# Patient Record
Sex: Female | Born: 1990 | Race: Black or African American | Hispanic: No | Marital: Married | Smoking: Never smoker
Health system: Southern US, Community
[De-identification: ages and names within clinical notes are randomized; demographics above are authoritative.]

## PROBLEM LIST (undated history)

## (undated) ENCOUNTER — Inpatient Hospital Stay (HOSPITAL_COMMUNITY): Payer: Self-pay

## (undated) DIAGNOSIS — Z5189 Encounter for other specified aftercare: Secondary | ICD-10-CM

## (undated) DIAGNOSIS — D649 Anemia, unspecified: Secondary | ICD-10-CM

## (undated) DIAGNOSIS — K8020 Calculus of gallbladder without cholecystitis without obstruction: Secondary | ICD-10-CM

## (undated) DIAGNOSIS — O139 Gestational [pregnancy-induced] hypertension without significant proteinuria, unspecified trimester: Secondary | ICD-10-CM

## (undated) DIAGNOSIS — O34219 Maternal care for unspecified type scar from previous cesarean delivery: Secondary | ICD-10-CM

## (undated) DIAGNOSIS — D219 Benign neoplasm of connective and other soft tissue, unspecified: Secondary | ICD-10-CM

## (undated) HISTORY — DX: Benign neoplasm of connective and other soft tissue, unspecified: D21.9

## (undated) HISTORY — DX: Calculus of gallbladder without cholecystitis without obstruction: K80.20

## (undated) HISTORY — PX: NO PAST SURGERIES: SHX2092

---

## 2013-04-07 LAB — OB RESULTS CONSOLE RPR: RPR: NONREACTIVE

## 2013-04-07 LAB — OB RESULTS CONSOLE RUBELLA ANTIBODY, IGM: Rubella: IMMUNE

## 2013-04-07 LAB — OB RESULTS CONSOLE HEPATITIS B SURFACE ANTIGEN: Hepatitis B Surface Ag: NEGATIVE

## 2013-09-15 LAB — OB RESULTS CONSOLE RPR: RPR: NONREACTIVE

## 2013-09-15 LAB — OB RESULTS CONSOLE GBS: GBS: NEGATIVE

## 2013-09-15 LAB — OB RESULTS CONSOLE HIV ANTIBODY (ROUTINE TESTING): HIV: NONREACTIVE

## 2013-09-30 ENCOUNTER — Inpatient Hospital Stay (HOSPITAL_COMMUNITY): Admission: AD | Admit: 2013-09-30 | Payer: Self-pay | Source: Ambulatory Visit | Admitting: Obstetrics and Gynecology

## 2013-10-12 ENCOUNTER — Telehealth (HOSPITAL_COMMUNITY): Payer: Self-pay | Admitting: *Deleted

## 2013-10-12 ENCOUNTER — Encounter (HOSPITAL_COMMUNITY): Payer: Self-pay | Admitting: *Deleted

## 2013-10-12 NOTE — Telephone Encounter (Signed)
Preadmission screen  

## 2013-10-13 ENCOUNTER — Other Ambulatory Visit: Payer: Self-pay | Admitting: Obstetrics and Gynecology

## 2013-10-14 ENCOUNTER — Inpatient Hospital Stay (HOSPITAL_COMMUNITY)
Admission: RE | Admit: 2013-10-14 | Discharge: 2013-10-16 | DRG: 774 | Disposition: A | Discharge status: Home / Self Care | Payer: Medicaid Other | Source: Ambulatory Visit | Attending: Obstetrics and Gynecology | Admitting: Obstetrics and Gynecology

## 2013-10-14 ENCOUNTER — Encounter (HOSPITAL_COMMUNITY): Payer: Medicaid Other | Admitting: Anesthesiology

## 2013-10-14 ENCOUNTER — Encounter (HOSPITAL_COMMUNITY): Payer: Self-pay

## 2013-10-14 ENCOUNTER — Inpatient Hospital Stay (HOSPITAL_COMMUNITY): Payer: Medicaid Other | Admitting: Anesthesiology

## 2013-10-14 DIAGNOSIS — D259 Leiomyoma of uterus, unspecified: Secondary | ICD-10-CM | POA: Diagnosis present

## 2013-10-14 DIAGNOSIS — O34599 Maternal care for other abnormalities of gravid uterus, unspecified trimester: Secondary | ICD-10-CM | POA: Diagnosis present

## 2013-10-14 DIAGNOSIS — O43893 Other placental disorders, third trimester: Secondary | ICD-10-CM

## 2013-10-14 DIAGNOSIS — D4959 Neoplasm of unspecified behavior of other genitourinary organ: Secondary | ICD-10-CM | POA: Diagnosis present

## 2013-10-14 LAB — CBC
HCT: 30.9 % — ABNORMAL LOW (ref 36.0–46.0)
Hemoglobin: 10.2 g/dL — ABNORMAL LOW (ref 12.0–15.0)
MCHC: 33 g/dL (ref 30.0–36.0)
MCV: 80.5 fL (ref 78.0–100.0)
WBC: 7.9 10*3/uL (ref 4.0–10.5)

## 2013-10-14 LAB — PREPARE RBC (CROSSMATCH)

## 2013-10-14 LAB — RPR: RPR Ser Ql: NONREACTIVE

## 2013-10-14 MED ORDER — PHENYLEPHRINE 40 MCG/ML (10ML) SYRINGE FOR IV PUSH (FOR BLOOD PRESSURE SUPPORT)
80.0000 ug | PREFILLED_SYRINGE | INTRAVENOUS | Status: DC | PRN
  Filled 2013-10-14: qty 10
  Filled 2013-10-14: qty 2

## 2013-10-14 MED ORDER — LIDOCAINE HCL (PF) 1 % IJ SOLN
30.0000 mL | INTRAMUSCULAR | Status: DC | PRN
  Filled 2013-10-14: qty 30

## 2013-10-14 MED ORDER — FLEET ENEMA 7-19 GM/118ML RE ENEM: 1.0000 | ENEMA | RECTAL | Status: DC | PRN

## 2013-10-14 MED ORDER — LACTATED RINGERS IV SOLN
INTRAVENOUS | Status: DC
  Administered 2013-10-14: 07:00:00 via INTRAVENOUS

## 2013-10-14 MED ORDER — IBUPROFEN 600 MG PO TABS: 600.0000 mg | ORAL_TABLET | Freq: Four times a day (QID) | ORAL | Status: DC | PRN

## 2013-10-14 MED ORDER — OXYTOCIN 40 UNITS IN LACTATED RINGERS INFUSION - SIMPLE MED: 62.5000 mL/h | INTRAVENOUS | Status: DC

## 2013-10-14 MED ORDER — FENTANYL 2.5 MCG/ML BUPIVACAINE 1/10 % EPIDURAL INFUSION (WH - ANES)
14.0000 mL/h | INTRAMUSCULAR | Status: DC | PRN
  Administered 2013-10-14: 14 mL/h via EPIDURAL
  Filled 2013-10-14: qty 125

## 2013-10-14 MED ORDER — OXYTOCIN BOLUS FROM INFUSION: 500.0000 mL | INTRAVENOUS | Status: DC

## 2013-10-14 MED ORDER — ONDANSETRON HCL 4 MG/2ML IJ SOLN: 4.0000 mg | Freq: Four times a day (QID) | INTRAMUSCULAR | Status: DC | PRN

## 2013-10-14 MED ORDER — LACTATED RINGERS IV SOLN: 500.0000 mL | INTRAVENOUS | Status: DC | PRN

## 2013-10-14 MED ORDER — CITRIC ACID-SODIUM CITRATE 334-500 MG/5ML PO SOLN: 30.0000 mL | ORAL | Status: DC | PRN

## 2013-10-14 MED ORDER — EPHEDRINE 5 MG/ML INJ
10.0000 mg | INTRAVENOUS | Status: DC | PRN
  Filled 2013-10-14: qty 4
  Filled 2013-10-14: qty 2

## 2013-10-14 MED ORDER — OXYCODONE-ACETAMINOPHEN 5-325 MG PO TABS: 1.0000 | ORAL_TABLET | ORAL | Status: DC | PRN

## 2013-10-14 MED ORDER — LACTATED RINGERS IV SOLN
INTRAVENOUS | Status: DC
  Administered 2013-10-14: 15:00:00 via INTRAVENOUS

## 2013-10-14 MED ORDER — SODIUM BICARBONATE 8.4 % IV SOLN
INTRAVENOUS | Status: DC | PRN
  Administered 2013-10-14: 5 mL via EPIDURAL

## 2013-10-14 MED ORDER — PHENYLEPHRINE 40 MCG/ML (10ML) SYRINGE FOR IV PUSH (FOR BLOOD PRESSURE SUPPORT)
80.0000 ug | PREFILLED_SYRINGE | INTRAVENOUS | Status: DC | PRN
  Filled 2013-10-14: qty 2

## 2013-10-14 MED ORDER — DIPHENHYDRAMINE HCL 50 MG/ML IJ SOLN: 12.5000 mg | INTRAMUSCULAR | Status: DC | PRN

## 2013-10-14 MED ORDER — ACETAMINOPHEN 325 MG PO TABS: 650.0000 mg | ORAL_TABLET | ORAL | Status: DC | PRN

## 2013-10-14 MED ORDER — LACTATED RINGERS IV SOLN: 500.0000 mL | Freq: Once | INTRAVENOUS | Status: DC

## 2013-10-14 MED ORDER — EPHEDRINE 5 MG/ML INJ
10.0000 mg | INTRAVENOUS | Status: DC | PRN
  Filled 2013-10-14: qty 2

## 2013-10-14 MED ORDER — LIDOCAINE HCL (PF) 1 % IJ SOLN
30.0000 mL | INTRAMUSCULAR | Status: DC | PRN
  Filled 2013-10-14 (×2): qty 30

## 2013-10-14 MED ORDER — OXYTOCIN 40 UNITS IN LACTATED RINGERS INFUSION - SIMPLE MED
1.0000 m[IU]/min | INTRAVENOUS | Status: DC
  Administered 2013-10-14: 5 m[IU]/min via INTRAVENOUS
  Administered 2013-10-14: 9 m[IU]/min via INTRAVENOUS
  Administered 2013-10-14: 11 m[IU]/min via INTRAVENOUS
  Administered 2013-10-14: 1 m[IU]/min via INTRAVENOUS
  Administered 2013-10-14: 7 m[IU]/min via INTRAVENOUS
  Filled 2013-10-14: qty 1000

## 2013-10-14 NOTE — Anesthesia Preprocedure Evaluation (Signed)

## 2013-10-14 NOTE — Progress Notes (Signed)
Patient ID: Alyssa Hubbard, female   DOB: 1990/12/28, 21 y.o.   MRN: [id number] The pt requested and received an epidural at 4PM and was 5 cm at [facility] and 6 cm at 7PM. Now, the cervix is fully dilated and the vertex is at +2 station. We will begin pushing

## 2013-10-14 NOTE — Anesthesia Procedure Notes (Signed)
Epidural Patient location during procedure: OB  Preanesthetic Checklist Completed: patient identified, site marked, surgical consent, pre-op evaluation, timeout performed, IV checked, risks and benefits discussed and monitors and equipment checked  Epidural Patient position: sitting Prep: site prepped and draped and DuraPrep Patient monitoring: continuous pulse ox and blood pressure Approach: midline Injection technique: LOR air  Needle:  Needle type: Tuohy  Needle gauge: 17 G Needle length: 9 cm and 9 Needle insertion depth: 8 cm Catheter type: closed end flexible Catheter size: 19 Gauge Catheter at skin depth: 17 cm Test dose: negative  Assessment Events: blood not aspirated, injection not painful, no injection resistance, negative IV test and no paresthesia  Additional Notes Dosing of Epidural:  1st dose, through catheter ............................................Marland Kitchen epi 1:200K + Xylocaine 40 mg  2nd dose, through catheter, after waiting 3 minutes...Marland KitchenMarland Kitchenepi 1:200K + Xylocaine 60 mg    ( 2% Xylo charted as a single dose in Epic Meds for ease of charting; actual dosing was fractionated as above, for saftey's sake)  As each dose occurred, patient was free of IV sx; and patient exhibited no evidence of SA injection.  Patient is more comfortable after epidural dosed. Please see RN's note for documentation of vital signs,and FHR which are stable.  Patient reminded not to try to ambulate with numb legs, and that an RN must be present when she attempts to get up.

## 2013-10-14 NOTE — Progress Notes (Signed)
Patient ID: Alyssa Hubbard, female   DOB: January 23, 1991, 22 y.o.   MRN: [id number] Pitocin has just been raised to 15 mu/ minute and the contractions are q 2-3 minutes but only slightly painful. There cervix is 3 cm 50% effaced and the vertex is at - 2 station AROM produced clear fluid.

## 2013-10-14 NOTE — Progress Notes (Signed)
Patient ID: Alyssa Hubbard, female   DOB: 10/14/1991, 22 y.o.   MRN: [id number] Delivery note:  The pt pushed well for 1 hour and delivered a living female infant spontaneously ROA over an intact perineum with bilateral vaginal and labial lacerations . The placenta was slow to release but appeared to deliver intact , however on exam of the uterus there was retained placenta.This was removed with ring forceps, manual extraction and a banjo curette. The bladder was emptied with a catheter but I still was unable to reach the fundus. The uterus contracted well. Apgars were 9 and 9 at 1 and 5 minutes. The right vaginal and labial laceration was sutured with 3-0 vicryl but the left was hemostatic and not repaired It was much smaller than the right. EBL 400 cc's.

## 2013-10-15 LAB — CBC
MCHC: 32.8 g/dL (ref 30.0–36.0)
MCV: 80.7 fL (ref 78.0–100.0)
Platelets: 143 10*3/uL — ABNORMAL LOW (ref 150–400)
RDW: 14.6 % (ref 11.5–15.5)

## 2013-10-15 MED ORDER — SIMETHICONE 80 MG PO CHEW: 80.0000 mg | CHEWABLE_TABLET | ORAL | Status: DC | PRN

## 2013-10-15 MED ORDER — IBUPROFEN 600 MG PO TABS
600.0000 mg | ORAL_TABLET | Freq: Four times a day (QID) | ORAL | Status: DC
  Administered 2013-10-15 – 2013-10-16 (×7): 600 mg via ORAL
  Filled 2013-10-15 (×5): qty 1

## 2013-10-15 MED ORDER — BENZOCAINE-MENTHOL 20-0.5 % EX AERO: 1.0000 "application " | INHALATION_SPRAY | CUTANEOUS | Status: DC | PRN

## 2013-10-15 MED ORDER — SENNOSIDES-DOCUSATE SODIUM 8.6-50 MG PO TABS
2.0000 | ORAL_TABLET | ORAL | Status: DC
  Administered 2013-10-16: 2 via ORAL
  Filled 2013-10-15: qty 2

## 2013-10-15 MED ORDER — PRENATAL MULTIVITAMIN CH
1.0000 | ORAL_TABLET | Freq: Every day | ORAL | Status: DC
  Administered 2013-10-15 – 2013-10-16 (×2): 1 via ORAL
  Filled 2013-10-15: qty 1

## 2013-10-15 MED ORDER — TETANUS-DIPHTH-ACELL PERTUSSIS 5-2.5-18.5 LF-MCG/0.5 IM SUSP: 0.5000 mL | Freq: Once | INTRAMUSCULAR | Status: DC

## 2013-10-15 MED ORDER — DIPHENHYDRAMINE HCL 25 MG PO CAPS: 25.0000 mg | ORAL_CAPSULE | Freq: Four times a day (QID) | ORAL | Status: DC | PRN

## 2013-10-15 MED ORDER — LACTATED RINGERS IV SOLN: INTRAVENOUS | Status: AC

## 2013-10-15 MED ORDER — OXYCODONE-ACETAMINOPHEN 5-325 MG PO TABS: 1.0000 | ORAL_TABLET | ORAL | Status: DC | PRN

## 2013-10-15 MED ORDER — LANOLIN HYDROUS EX OINT: TOPICAL_OINTMENT | CUTANEOUS | Status: DC | PRN

## 2013-10-15 MED ORDER — ONDANSETRON HCL 4 MG PO TABS: 4.0000 mg | ORAL_TABLET | ORAL | Status: DC | PRN

## 2013-10-15 MED ORDER — ONDANSETRON HCL 4 MG/2ML IJ SOLN: 4.0000 mg | INTRAMUSCULAR | Status: DC | PRN

## 2013-10-15 MED ORDER — MEASLES, MUMPS & RUBELLA VAC ~~LOC~~ INJ
0.5000 mL | INJECTION | Freq: Once | SUBCUTANEOUS | Status: DC
  Filled 2013-10-15: qty 0.5

## 2013-10-15 MED ORDER — ZOLPIDEM TARTRATE 5 MG PO TABS: 5.0000 mg | ORAL_TABLET | Freq: Every evening | ORAL | Status: DC | PRN

## 2013-10-15 MED ORDER — DIBUCAINE 1 % RE OINT: 1.0000 "application " | TOPICAL_OINTMENT | RECTAL | Status: DC | PRN

## 2013-10-15 MED ORDER — WITCH HAZEL-GLYCERIN EX PADS: 1.0000 "application " | MEDICATED_PAD | CUTANEOUS | Status: DC | PRN

## 2013-10-15 MED ORDER — OXYTOCIN 40 UNITS IN LACTATED RINGERS INFUSION - SIMPLE MED: 62.5000 mL/h | INTRAVENOUS | Status: AC | PRN

## 2013-10-15 NOTE — Progress Notes (Signed)
UR chart review completed.  

## 2013-10-15 NOTE — Anesthesia Postprocedure Evaluation (Signed)
  Anesthesia Post-op Note  Patient: Alyssa Hubbard  Procedure(s) Performed: * No procedures listed *  Patient Location: PACU and Mother/Baby  Anesthesia Type:Epidural  Level of Consciousness: awake, alert  and oriented  Airway and Oxygen Therapy: Patient Spontanous Breathing  Post-op Pain: none  Post-op Assessment: Post-op Vital signs reviewed, Patient's Cardiovascular Status Stable, No headache, No backache, No residual numbness and No residual motor weakness  Post-op Vital Signs: Reviewed and stable  Complications: No apparent anesthesia complications

## 2013-10-15 NOTE — Lactation Note (Signed)
This note was copied from the chart of Alyssa Hubbard. Lactation Consultation Note  Patient Name: Alyssa Hubbard ZOXWR'U Date: 10/15/2013 Reason for consult: Follow-up assessment;Difficult latch and hx of baby being reluctant to open mouth wide and keeping her tongue on roof of mouth.  LC had assisted earlier today and taught latch techniques and suck training.  Mom had asked for further assistance and her nurse was unavailable so LC attempted to assist.  However, baby was asleep and mom was going to bathroom, so LC encouraged FOB to try waking and suck training and LC returned after 20 minutes but now baby asleep and mom in shower.  LC reported assessment and attempted assistance to RN, Larey Brick. LC encouraged FOB to try suck training ad lib but especially prior to next latch attempt.   Maternal Data    Feeding Feeding Type: Breast Fed  LATCH Score/Interventions         LATCH score earlier today with LC assistance=8             Lactation Tools Discussed/Used   Suck training Cue feeding  Consult Status Consult Status: Follow-up Date: 10/16/13 Follow-up type: In-patient    Warrick Parisian [facility] 10/15/2013, 10:08 PM

## 2013-10-15 NOTE — Progress Notes (Signed)
Patient ID: Alyssa Hubbard, female   DOB: 12/03/1990, 22 y.o.   MRN: [id number] #1 afebrile BP normal no complaints HGB stable

## 2013-10-15 NOTE — Lactation Note (Signed)
This note was copied from the chart of Alyssa Hubbard. Lactation Consultation Note  Patient Name: Alyssa Hubbard ZOXWR'U Date: 10/15/2013 Reason for consult: Initial assessment Mom is having difficulty getting baby to sustain a latch. Baby keeps her tongue up at the roof of her mouth. Demonstrated suck training to Mom, hand expression. Baby latched easily with LC assist in football hold on the left breast. Mom has history of nipple piercing. The right nipple is slightly mishappened. Demonstrated breast compression to Mom to help with latch. BF basics reviewed. Encouraged to BF with feeding ques, cluster feeding discussed. Lactation brochure left for review, advised of OP services and support group. Advised Mom to call as needed for assist.   Maternal Data Formula Feeding for Exclusion: No Infant to breast within first hour of birth: Yes Has patient been taught Hand Expression?: Yes Does the patient have breastfeeding experience prior to this delivery?: No  Feeding Feeding Type: Breast Fed Length of feed:  (few sucks.)  LATCH Score/Interventions Latch: Grasps breast easily, tongue down, lips flanged, rhythmical sucking. Intervention(s): Adjust position;Assist with latch;Breast massage;Breast compression  Audible Swallowing: A few with stimulation Intervention(s): Skin to skin;Hand expression  Type of Nipple: Everted at rest and after stimulation  Comfort (Breast/Nipple): Soft / non-tender     Hold (Positioning): Assistance needed to correctly position infant at breast and maintain latch. Intervention(s): Breastfeeding basics reviewed;Support Pillows;Position options;Skin to skin  LATCH Score: 8  Lactation Tools Discussed/Used Tools: Pump Breast pump type: Manual WIC Program: No   Consult Status Consult Status: Follow-up Date: 10/16/13 Follow-up type: In-patient    Alfred Levins 10/15/2013, 1:09 PM

## 2013-10-16 MED ORDER — IBUPROFEN 600 MG PO TABS: 600.0000 mg | ORAL_TABLET | Freq: Four times a day (QID) | ORAL | Status: DC | PRN

## 2013-10-16 MED ORDER — FERROUS SULFATE 325 (65 FE) MG PO TABS: 325.0000 mg | ORAL_TABLET | Freq: Two times a day (BID) | ORAL | Status: DC

## 2013-10-16 NOTE — Progress Notes (Signed)
Patient ID: Alyssa Hubbard, female   DOB: 1991-10-08, 22 y.o.   MRN: [id number] #2 afebrile BP normal no complaints for d/c.

## 2013-10-16 NOTE — H&P (Signed)
Alyssa Hubbard, Alyssa Hubbard NO.:  [id number]  MEDICAL RECORD NO.:  [id number]  LOCATION:                                 FACILITY:  PHYSICIAN:  Malachi Pro. Ambrose Mantle, M.D. DATE OF BIRTH:  04/05/1991  DATE OF ADMISSION:  10/14/2013 DATE OF DISCHARGE:                             HISTORY & PHYSICAL   PRESENT ILLNESS:  This is a 22 year old black female, para 0, gravida 1, EDC October 20, 2013, admitted for induction of labor.  Blood group and type A positive.  Negative antibody.  Pap smear normal.  Rubella immune. RPR negative.  Urine culture negative.  Hepatitis B surface antigen negative.  HIV negative.  Varicella immune, hemoglobin AA, chlamydia and gonorrhea negative.  Cystic fibrosis negative.  Second trimester quad screen negative.  Diabetes screen 82 and 89.  Group B strep negative. Repeat gonorrhea and Chlamydia negative.  The patient's prenatal course began at 12 weeks.  She had an early Glucola screen which was negative. She did have a fibroid low in the uterus that could complicate delivery at the time of her early ultrasound.  Her weight started at 237 pounds and her last prenatal visit was 266 pounds for a total weight gain of 29 pounds.  Her blood pressure remained normal until the last visit, when it was 138/90.  PIH labs on the blood were negative.  Her 24 hour urine protein is not returned.  PAST MEDICAL HISTORY:  Reveals no known allergies.  No operations.  She has had no serious illnesses.  She does not smoke.  Does not drink. Does not take illicit drugs.  12th  Grade education.  Works in Clinical biochemist.  FAMILY HISTORY:  Sister with diabetes started at age 56.  A maternal grandmother with diabetes.  PHYSICAL EXAMINATION:  GENERAL:  On admission, reveals a well-developed, obese, white black female, in no distress. HEAD, EYES, NOSE, AND THROAT:  Normal. LUNGS:  Clear to auscultation. HEART:  Normal size and sounds.  No murmurs. ABDOMEN:  Soft.   Fundal height 40.5 cm.  Fetal heart tones are normal. Cervix is 2 cm, 50% vertex at a -3.  ADMITTING IMPRESSION:  Intrauterine pregnancy at 39 weeks and 1 day. The patient is admitted for induction of labor.     Malachi Pro. Ambrose Mantle, M.D.     TFH/MEDQ  D:  10/13/2013  T:  10/13/2013  Job:  [id number]

## 2013-10-17 LAB — TYPE AND SCREEN
Antibody Screen: NEGATIVE
Unit division: 0

## 2013-10-17 NOTE — Discharge Summary (Signed)
[name], Alyssa Hubbard NO.:  [id number]  MEDICAL RECORD NO.:  [id number]  LOCATION:  9118                          FACILITY:  [facility]  PHYSICIAN:  Malachi Pro. Ambrose Mantle, M.D. DATE OF BIRTH:  08/19/1991  DATE OF ADMISSION:  10/14/2013 DATE OF DISCHARGE:  10/16/2013                              DISCHARGE SUMMARY   HOSPITAL COURSE:  This is a 22 year old black female, para 0, gravida 1, EDC on October 20, 2013, admitted for induction of labor.  Blood group and type A positive with a negative antibody.  Pap smear was normal. Varicella immune.  Rubella immune.  RPR nonreactive.  Urine culture negative.  Hepatitis B surface antigen negative.  HIV negative. Hemoglobin AA.  Gonorrhea, Chlamydia, and cystic fibrosis negative.  One- hour Glucola checked twice was 82 and 89, group B strep was negative. Repeat gonorrhea and Chlamydia were negative.  The patient had an uncomplicated prenatal course.  Late in pregnancy, she did have borderline elevated blood pressures and a 24-hour urine was normal, less than 200 mg per 24 hours, and PIH labs on the blood were normal.  After admission to the hospital, she was placed on Pitocin at 1:17 p.m. Pitocin was 15 milliunits a minute.  Contractions were slightly painful, cervix 3 cm, 50%, artificial rupture of the membranes produced clear fluid.  She received an epidural at 4:00 p.m., 5 cm at 6:00 p.m., 6 cm at 7:00 p.m., and by 8:27 p.m., the cervix was fully dilated, vertex is at a +2 station.  She pushed for 1 hour and delivered a living female infant spontaneously ROA over an intact perineum with bilateral vaginal and labial lacerations.  The placenta was slowed to release, but appeared to deliver intact, however on exam of the uterus, there was retained placenta.  This was removed with the ring forceps, manual extraction, and a banjo curette.  The bladder was emptied with a catheter, but I still was unable to reach the fundus.  The  uterus contracted well.  Apgars were 9 and 9 at 1 and 5 minutes.  The right vaginal labial laceration was sutured with 3-0 Vicryl, but the left was hemostatic and not repaired.  It was much smaller than the right.  Blood loss about 400 mL.  Postpartum, the patient did very well and was discharged on the second postpartum day.  LABORATORY DATA:  Showed initial hemoglobin of 10.2, hematocrit 30.9, white count 7900, platelet count 166,000.  RPR was nonreactive. Followup hemoglobin 9.9.  FINAL DIAGNOSES:  Intrauterine pregnancy at 39 weeks, delivered vertex right occiput anterior, partially retained placenta.  OPERATION:  Spontaneous delivery ROA, repair of right vaginal and labial laceration, removal of retained placenta with ring forceps, manual extraction and banjo curette.  FINAL CONDITION:  Improved.  INSTRUCTIONS:  Include our regular discharge instruction booklet. Thereafter, visit summary and prescriptions for Motrin 600 mg one every 6 hours as needed for pain 30 tablets, and ferrous sulfate 325 mg twice daily with one refill 60 tablets to start with.  She is to return in 6 weeks for followup examination.     Malachi Pro. Ambrose Mantle, M.D.     TFH/MEDQ  D:  10/16/2013  T:  10/17/2013  Job:  [id number]

## 2014-05-17 ENCOUNTER — Emergency Department (HOSPITAL_COMMUNITY): Payer: Medicaid Other

## 2014-05-17 ENCOUNTER — Emergency Department (HOSPITAL_COMMUNITY)
Admission: EM | Admit: 2014-05-17 | Discharge: 2014-05-17 | Disposition: A | Discharge status: Home / Self Care | Payer: Medicaid Other | Attending: Emergency Medicine | Admitting: Emergency Medicine

## 2014-05-17 ENCOUNTER — Encounter (HOSPITAL_COMMUNITY): Payer: Self-pay | Admitting: Emergency Medicine

## 2014-05-17 DIAGNOSIS — R1013 Epigastric pain: Secondary | ICD-10-CM

## 2014-05-17 DIAGNOSIS — Z8742 Personal history of other diseases of the female genital tract: Secondary | ICD-10-CM | POA: Insufficient documentation

## 2014-05-17 DIAGNOSIS — K8020 Calculus of gallbladder without cholecystitis without obstruction: Secondary | ICD-10-CM

## 2014-05-17 DIAGNOSIS — Z3202 Encounter for pregnancy test, result negative: Secondary | ICD-10-CM | POA: Insufficient documentation

## 2014-05-17 LAB — COMPREHENSIVE METABOLIC PANEL
ALK PHOS: 61 U/L (ref 39–117)
ALT: 29 U/L (ref 0–35)
AST: 58 U/L — AB (ref 0–37)
Albumin: 3.5 g/dL (ref 3.5–5.2)
BUN: 10 mg/dL (ref 6–23)
CO2: 24 mEq/L (ref 19–32)
Calcium: 9.1 mg/dL (ref 8.4–10.5)
Chloride: 102 mEq/L (ref 96–112)
Creatinine, Ser: 0.54 mg/dL (ref 0.50–1.10)
GFR calc non Af Amer: >90 mL/min (ref >90)
GLUCOSE: 127 mg/dL — AB (ref 70–99)
POTASSIUM: 4.1 meq/L (ref 3.7–5.3)
SODIUM: 138 meq/L (ref 137–147)
TOTAL PROTEIN: 7.7 g/dL (ref 6.0–8.3)
Total Bilirubin: 0.2 mg/dL — ABNORMAL LOW (ref 0.3–1.2)

## 2014-05-17 LAB — CBC WITH DIFFERENTIAL/PLATELET
BASOS ABS: 0 10*3/uL (ref 0.0–0.1)
Basophils Relative: 0 % (ref 0–1)
EOS ABS: 0 10*3/uL (ref 0.0–0.7)
Eosinophils Relative: 1 % (ref 0–5)
HCT: 33.1 % — ABNORMAL LOW (ref 36.0–46.0)
Hemoglobin: 10.4 g/dL — ABNORMAL LOW (ref 12.0–15.0)
LYMPHS PCT: 17 % (ref 12–46)
Lymphs Abs: 1 10*3/uL (ref 0.7–4.0)
MCH: 25.7 pg — ABNORMAL LOW (ref 26.0–34.0)
MCHC: 31.4 g/dL (ref 30.0–36.0)
MCV: 81.9 fL (ref 78.0–100.0)
Monocytes Absolute: 0.4 10*3/uL (ref 0.1–1.0)
Monocytes Relative: 6 % (ref 3–12)
NEUTROS PCT: 76 % (ref 43–77)
Neutro Abs: 4.7 10*3/uL (ref 1.7–7.7)
PLATELETS: 187 10*3/uL (ref 150–400)
RBC: 4.04 MIL/uL (ref 3.87–5.11)
RDW: 13.6 % (ref 11.5–15.5)
WBC: 6.1 10*3/uL (ref 4.0–10.5)

## 2014-05-17 LAB — URINALYSIS, ROUTINE W REFLEX MICROSCOPIC
BILIRUBIN URINE: NEGATIVE
Glucose, UA: NEGATIVE mg/dL
Ketones, ur: NEGATIVE mg/dL
Nitrite: NEGATIVE
Protein, ur: NEGATIVE mg/dL
Specific Gravity, Urine: 1.029 (ref 1.005–1.030)
UROBILINOGEN UA: 1 mg/dL (ref 0.0–1.0)
pH: 6 (ref 5.0–8.0)

## 2014-05-17 LAB — URINE MICROSCOPIC-ADD ON

## 2014-05-17 LAB — LIPASE, BLOOD: Lipase: 17 U/L (ref 11–59)

## 2014-05-17 LAB — PREGNANCY, URINE: Preg Test, Ur: NEGATIVE

## 2014-05-17 MED ORDER — OXYCODONE-ACETAMINOPHEN 5-325 MG PO TABS: 1.0000 | ORAL_TABLET | Freq: Four times a day (QID) | ORAL | Status: DC | PRN

## 2014-05-17 NOTE — ED Provider Notes (Signed)
8:09 AM: US shows evidence of cholelithiasis, but no cholecystitis. Pt appears comfortable on exam. Sx possibly d/t biliary colic vs reflux. Will provide small amount of pain medicine for home. I have discussed the diagnosis/risks/treatment options with the patient and believe the pt to be eligible for discharge home to follow-up with her pcp. We also discussed returning to the ED immediately if new or worsening sx occur. We discussed the sx which are most concerning (e.g., worsening pain, fever) that necessitate immediate return. Medications administered to the patient during their visit and any new prescriptions provided to the patient are listed below.  Medications given during this visit Medications - No data to display  New Prescriptions   OXYCODONE-ACETAMINOPHEN (PERCOCET) 5-325 MG PER TABLET    Take 1 tablet by mouth every 6 (six) hours as needed.   Clinical Impression 1. Abdominal pain, epigastric   2. Calculus of gallbladder without cholecystitis without obstruction       Blanchard Kelch, MD 05/17/14 [phone]

## 2014-05-17 NOTE — ED Provider Notes (Signed)
CSN: [id number]     Arrival date & time 05/17/14  5465 History   First MD Initiated Contact with Patient 05/17/14 [phone]     Chief Complaint  Patient presents with  . Abdominal Pain      HPI Pt was seen at 0440.  Per pt, c/o gradual onset and improvement of one episode of upper abd "pain" that began several hours PTA.  Pt states she ate a fast food meal at 2130 and went to bed at 2230.  Describes the abd pain as "cramping."  Pt states she "feels better" since she came to the ED. Denies N/V, no diarrhea, no fevers, no back pain, no rash, no CP/SOB, no black or blood in stools or emesis.        Past Medical History  Diagnosis Date  . Fibroid    History reviewed. No pertinent past surgical history.  History  Substance Use Topics  . Smoking status: Never Smoker   . Smokeless tobacco: Not on file  . Alcohol Use: No   OB History   Grav Para Term Preterm Abortions TAB SAB Ect Mult Living   1 1 1       1      Review of Systems ROS: Statement: All systems negative except as marked or noted in the HPI; Constitutional: Negative for fever and chills. ; ; Eyes: Negative for eye pain, redness and discharge. ; ; ENMT: Negative for ear pain, hoarseness, nasal congestion, sinus pressure and sore throat. ; ; Cardiovascular: Negative for chest pain, palpitations, diaphoresis, dyspnea and peripheral edema. ; ; Respiratory: Negative for cough, wheezing and stridor. ; ; Gastrointestinal: +abd pain. Negative for nausea, vomiting, diarrhea, blood in stool, hematemesis, jaundice and rectal bleeding. . ; ; Genitourinary: Negative for dysuria, flank pain and hematuria. ; ; Musculoskeletal: Negative for back pain and neck pain. Negative for swelling and trauma.; ; Skin: Negative for pruritus, rash, abrasions, blisters, bruising and skin lesion.; ; Neuro: Negative for headache, lightheadedness and neck stiffness. Negative for weakness, altered level of consciousness , altered mental status, extremity weakness,  paresthesias, involuntary movement, seizure and syncope.      Allergies  Review of patient's allergies indicates no known allergies.  Home Medications   Prior to Admission medications   Medication Sig Start Date End Date Taking? Authorizing Provider  ibuprofen (ADVIL,MOTRIN) 200 MG tablet Take 200 mg by mouth every 6 (six) hours as needed for mild pain.   Yes Historical Provider, MD   BP 136/71  Pulse 94  Temp(Src) 97.9 F (36.6 C) (Oral)  Resp 20  Ht 5\' 8"  (1.727 m)  Wt 264 lb 9 oz (120.005 kg)  BMI 40.24 kg/m2  SpO2 100%  LMP 05/17/2014 Physical Exam 0445: Physical examination:  Nursing notes reviewed; Vital signs and O2 SAT reviewed;  Constitutional: Well developed, Well nourished, Well hydrated, In no acute distress; Head:  Normocephalic, atraumatic; Eyes: EOMI, PERRL, No scleral icterus; ENMT: Mouth and pharynx normal, Mucous membranes moist; Neck: Supple, Full range of motion, No lymphadenopathy; Cardiovascular: Regular rate and rhythm, No murmur, rub, or gallop; Respiratory: Breath sounds clear & equal bilaterally, No rales, rhonchi, wheezes.  Speaking full sentences with ease, Normal respiratory effort/excursion; Chest: Nontender, Movement normal; Abdomen: Soft, Nontender, Nondistended, Normal bowel sounds; Genitourinary: No CVA tenderness; Extremities: Pulses normal, No tenderness, No edema, No calf edema or asymmetry.; Neuro: AA&Ox3, Major CN grossly intact.  Speech clear. No gross focal motor or sensory deficits in extremities.; Skin: Color normal, Warm, Dry.  ED Course  Procedures    MDM  MDM Reviewed: previous chart, nursing note and vitals Reviewed previous: labs Interpretation: labs and x-ray     Results for orders placed during the hospital encounter of 05/17/14  CBC WITH DIFFERENTIAL      Result Value Ref Range   WBC 6.1  4.0 - 10.5 K/uL   RBC 4.04  3.87 - 5.11 MIL/uL   Hemoglobin 10.4 (*) 12.0 - 15.0 g/dL   HCT 33.1 (*) 36.0 - 46.0 %   MCV 81.9  78.0  - 100.0 fL   MCH 25.7 (*) 26.0 - 34.0 pg   MCHC 31.4  30.0 - 36.0 g/dL   RDW 13.6  11.5 - 15.5 %   Platelets 187  150 - 400 K/uL   Neutrophils Relative % 76  43 - 77 %   Neutro Abs 4.7  1.7 - 7.7 K/uL   Lymphocytes Relative 17  12 - 46 %   Lymphs Abs 1.0  0.7 - 4.0 K/uL   Monocytes Relative 6  3 - 12 %   Monocytes Absolute 0.4  0.1 - 1.0 K/uL   Eosinophils Relative 1  0 - 5 %   Eosinophils Absolute 0.0  0.0 - 0.7 K/uL   Basophils Relative 0  0 - 1 %   Basophils Absolute 0.0  0.0 - 0.1 K/uL  COMPREHENSIVE METABOLIC PANEL      Result Value Ref Range   Sodium 138  137 - 147 mEq/L   Potassium 4.1  3.7 - 5.3 mEq/L   Chloride 102  96 - 112 mEq/L   CO2 24  19 - 32 mEq/L   Glucose, Bld 127 (*) 70 - 99 mg/dL   BUN 10  6 - 23 mg/dL   Creatinine, Ser 0.54  0.50 - 1.10 mg/dL   Calcium 9.1  8.4 - 10.5 mg/dL   Total Protein 7.7  6.0 - 8.3 g/dL   Albumin 3.5  3.5 - 5.2 g/dL   AST 58 (*) 0 - 37 U/L   ALT 29  0 - 35 U/L   Alkaline Phosphatase 61  39 - 117 U/L   Total Bilirubin 0.2 (*) 0.3 - 1.2 mg/dL   GFR calc non Af Amer >90  >90 mL/min   GFR calc Af Amer >90  >90 mL/min  LIPASE, BLOOD      Result Value Ref Range   Lipase 17  11 - 59 U/L  URINALYSIS, ROUTINE W REFLEX MICROSCOPIC      Result Value Ref Range   Color, Urine YELLOW  YELLOW   APPearance CLOUDY (*) CLEAR   Specific Gravity, Urine 1.029  1.005 - 1.030   pH 6.0  5.0 - 8.0   Glucose, UA NEGATIVE  NEGATIVE mg/dL   Hgb urine dipstick LARGE (*) NEGATIVE   Bilirubin Urine NEGATIVE  NEGATIVE   Ketones, ur NEGATIVE  NEGATIVE mg/dL   Protein, ur NEGATIVE  NEGATIVE mg/dL   Urobilinogen, UA 1.0  0.0 - 1.0 mg/dL   Nitrite NEGATIVE  NEGATIVE   Leukocytes, UA TRACE (*) NEGATIVE  PREGNANCY, URINE      Result Value Ref Range   Preg Test, Ur NEGATIVE  NEGATIVE  URINE MICROSCOPIC-ADD ON      Result Value Ref Range   Squamous Epithelial / LPF FEW (*) RARE   WBC, UA 3-6  <3 WBC/hpf   RBC / HPF 7-10  <3 RBC/hpf   Bacteria, UA FEW (*)  RARE     0710:  [place] results pending. Sign out to Dr. Aline Brochure.     Alfonzo Feller, DO 05/17/14 [phone]

## 2014-05-17 NOTE — ED Notes (Signed)
Patient here complaining of epigastric pain which radiates to upper back and upper chest. States that she ate some bojangles shortly before bed. Endorses several episodes of the same over the past few weeks. Additional discussion with patient reveals that eating habits are poor and may play a role in cycle of symptoms.

## 2014-05-17 NOTE — ED Notes (Signed)
The pt is c/o epigastric pain early this am with radaition around posteriorly.  No nv constipated earlier today.  lmp now

## 2014-05-17 NOTE — ED Notes (Signed)
Pt placed in gown, cardiac monitor, bp cuff and pulse ox

## 2014-05-17 NOTE — ED Notes (Signed)
Cath not required, patient voided.

## 2014-07-20 LAB — OB RESULTS CONSOLE GC/CHLAMYDIA
Chlamydia: NEGATIVE
Gonorrhea: NEGATIVE

## 2014-07-20 LAB — OB RESULTS CONSOLE ANTIBODY SCREEN: Antibody Screen: NEGATIVE

## 2014-07-20 LAB — OB RESULTS CONSOLE ABO/RH: RH Type: POSITIVE

## 2014-07-20 LAB — OB RESULTS CONSOLE HEPATITIS B SURFACE ANTIGEN: Hepatitis B Surface Ag: NEGATIVE

## 2014-07-20 LAB — OB RESULTS CONSOLE HIV ANTIBODY (ROUTINE TESTING): HIV: NONREACTIVE

## 2014-07-20 LAB — OB RESULTS CONSOLE RPR: RPR: NONREACTIVE

## 2014-07-20 LAB — OB RESULTS CONSOLE RUBELLA ANTIBODY, IGM: Rubella: IMMUNE

## 2014-09-27 ENCOUNTER — Encounter (HOSPITAL_COMMUNITY): Payer: Self-pay | Admitting: Emergency Medicine

## 2014-10-28 ENCOUNTER — Other Ambulatory Visit (HOSPITAL_COMMUNITY): Payer: Self-pay | Admitting: Obstetrics and Gynecology

## 2014-12-07 ENCOUNTER — Ambulatory Visit (HOSPITAL_COMMUNITY)
Admission: RE | Admit: 2014-12-07 | Discharge: 2014-12-07 | Disposition: A | Discharge status: Home / Self Care | Payer: Medicaid Other | Source: Ambulatory Visit | Attending: Obstetrics and Gynecology | Admitting: Obstetrics and Gynecology

## 2014-12-13 ENCOUNTER — Other Ambulatory Visit (HOSPITAL_COMMUNITY): Payer: Self-pay | Admitting: Obstetrics and Gynecology

## 2014-12-13 ENCOUNTER — Ambulatory Visit (HOSPITAL_COMMUNITY)
Admission: RE | Admit: 2014-12-13 | Discharge: 2014-12-13 | Disposition: A | Discharge status: Home / Self Care | Payer: Medicaid Other | Source: Ambulatory Visit | Attending: Obstetrics and Gynecology | Admitting: Obstetrics and Gynecology

## 2014-12-13 DIAGNOSIS — O26843 Uterine size-date discrepancy, third trimester: Secondary | ICD-10-CM | POA: Diagnosis not present

## 2014-12-13 DIAGNOSIS — O4453 Low lying placenta with hemorrhage, third trimester: Secondary | ICD-10-CM | POA: Insufficient documentation

## 2014-12-13 DIAGNOSIS — O4403 Placenta previa specified as without hemorrhage, third trimester: Secondary | ICD-10-CM | POA: Diagnosis not present

## 2014-12-13 DIAGNOSIS — Z36 Encounter for antenatal screening of mother: Secondary | ICD-10-CM | POA: Insufficient documentation

## 2014-12-13 DIAGNOSIS — O365930 Maternal care for other known or suspected poor fetal growth, third trimester, not applicable or unspecified: Secondary | ICD-10-CM | POA: Diagnosis not present

## 2014-12-13 DIAGNOSIS — Z3A29 29 weeks gestation of pregnancy: Secondary | ICD-10-CM | POA: Insufficient documentation

## 2014-12-13 DIAGNOSIS — Z3689 Encounter for other specified antenatal screening: Secondary | ICD-10-CM | POA: Insufficient documentation

## 2015-01-25 LAB — OB RESULTS CONSOLE GBS: GBS: POSITIVE

## 2015-02-09 ENCOUNTER — Encounter (HOSPITAL_COMMUNITY): Payer: Self-pay | Admitting: *Deleted

## 2015-02-09 ENCOUNTER — Telehealth (HOSPITAL_COMMUNITY): Payer: Self-pay | Admitting: *Deleted

## 2015-02-09 NOTE — Telephone Encounter (Signed)
Preadmission screen  

## 2015-02-20 ENCOUNTER — Other Ambulatory Visit (HOSPITAL_COMMUNITY): Payer: Self-pay | Admitting: Obstetrics and Gynecology

## 2015-02-20 NOTE — H&P (Signed)
NAMECATHERYNE, Hubbard Alyssa Hubbard.:  [id number]  MEDICAL RECORD Alyssa Hubbard.:  [id number]  LOCATION:                                 FACILITY:  PHYSICIAN:  Lucille Passy. Ulanda Edison, M.D. DATE OF BIRTH:  03-07-1991  DATE OF ADMISSION:  02/21/2015 DATE OF DISCHARGE:                             HISTORY & PHYSICAL   HISTORY OF PRESENT ILLNESS:  This is a 24 year old black female, para 1- 0-0-1, gravida 2, EDC February 24, 2015, admitted for induction of labor. Blood group and type A positive with negative antibody.  RPR negative. Urine culture negative.  Hepatitis B surface antigen negative, HIV negative, GC and Chlamydia negative.  Varicella immune.  Rubella immune. Hemoglobin electrophoresis AA.  Pap smear normal.  One-hour Glucola 94. Group B strep was positive.  Repeat GC and Chlamydia negative.  Repeat HIV and RPR negative.  The patient began her prenatal course early in pregnancy.  Ultrasound early in pregnancy showed a fibroid, otherwise was normal.  The patient decided against screening tests.  Initial 18- week ultrasound was incomplete.  Followup ultrasound showed normal anatomy.  Essentially, she had an uncomplicated prenatal course.  Her last exam on February 14, 2015, showed the cervix to be 2 cm, 30% vertex, at a -4.  Weight loss over the entire pregnancy was 6 pounds.  PAST MEDICAL HISTORY:  Showed that she had sensitive skin.  She had gallstones in June of 2015.  She had a torn ACL in her right knee, but Alyssa Hubbard repair was done, it was in the 8th grade.  PAST SURGICAL HISTORY:  None.  ALLERGIES:  Alyssa Hubbard known drug allergies.  Alyssa Hubbard latex or food allergies.  SOCIAL HISTORY:  Never smoked.  Does not drink.  Did not take illegal drugs.  12th grade education.  Works Therapist, art.  She is single.  FAMILY HISTORY:  Sister began having diabetes at age 33, maternal grandmother with diabetes.  PHYSICAL EXAMINATION:  VITAL SIGNS:  At her last prenatal visit; blood pressure was 128/80, pulse  of 80, weight 254 pounds. HEART:  Normal size and sounds.  Alyssa Hubbard murmurs. LUNGS:  Clear to auscultation.  Fundal height 39.5 cm.  Cervix 2 cm, 30% vertex at a -4.  ADMITTING IMPRESSION:  Intrauterine pregnancy, 39 weeks and 4 days. Positive group B strep.  The patient will be admitted and placed on Pitocin and penicillin.    Lucille Passy. Ulanda Edison, M.D.    TFH/MEDQ  D:  02/20/2015  T:  02/20/2015  Job:  [id number]

## 2015-02-21 ENCOUNTER — Encounter (HOSPITAL_COMMUNITY): Payer: Self-pay

## 2015-02-21 ENCOUNTER — Inpatient Hospital Stay (HOSPITAL_COMMUNITY): Payer: Medicaid Other | Admitting: Anesthesiology

## 2015-02-21 ENCOUNTER — Inpatient Hospital Stay (HOSPITAL_COMMUNITY)
Admission: RE | Admit: 2015-02-21 | Discharge: 2015-02-23 | DRG: 775 | Disposition: A | Discharge status: Home / Self Care | Payer: Medicaid Other | Source: Ambulatory Visit | Attending: Obstetrics and Gynecology | Admitting: Obstetrics and Gynecology

## 2015-02-21 DIAGNOSIS — O99824 Streptococcus B carrier state complicating childbirth: Secondary | ICD-10-CM | POA: Diagnosis present

## 2015-02-21 DIAGNOSIS — H109 Unspecified conjunctivitis: Secondary | ICD-10-CM | POA: Diagnosis present

## 2015-02-21 DIAGNOSIS — O9902 Anemia complicating childbirth: Secondary | ICD-10-CM | POA: Diagnosis present

## 2015-02-21 DIAGNOSIS — Z3A39 39 weeks gestation of pregnancy: Secondary | ICD-10-CM | POA: Diagnosis present

## 2015-02-21 DIAGNOSIS — D259 Leiomyoma of uterus, unspecified: Secondary | ICD-10-CM | POA: Diagnosis present

## 2015-02-21 DIAGNOSIS — D649 Anemia, unspecified: Secondary | ICD-10-CM | POA: Diagnosis present

## 2015-02-21 DIAGNOSIS — Z3490 Encounter for supervision of normal pregnancy, unspecified, unspecified trimester: Secondary | ICD-10-CM

## 2015-02-21 DIAGNOSIS — O3413 Maternal care for benign tumor of corpus uteri, third trimester: Secondary | ICD-10-CM | POA: Diagnosis present

## 2015-02-21 HISTORY — DX: Anemia, unspecified: D64.9

## 2015-02-21 LAB — CBC
HEMATOCRIT: 31.7 % — AB (ref 36.0–46.0)
Hemoglobin: 10.3 g/dL — ABNORMAL LOW (ref 12.0–15.0)
MCH: 26.8 pg (ref 26.0–34.0)
MCHC: 32.5 g/dL (ref 30.0–36.0)
MCV: 82.6 fL (ref 78.0–100.0)
Platelets: 129 10*3/uL — ABNORMAL LOW (ref 150–400)
RBC: 3.84 MIL/uL — AB (ref 3.87–5.11)
RDW: 14 % (ref 11.5–15.5)
WBC: 7.4 10*3/uL (ref 4.0–10.5)

## 2015-02-21 LAB — TYPE AND SCREEN
ABO/RH(D): A POS
ANTIBODY SCREEN: NEGATIVE

## 2015-02-21 LAB — RPR: RPR Ser Ql: NONREACTIVE

## 2015-02-21 MED ORDER — LANOLIN HYDROUS EX OINT: TOPICAL_OINTMENT | CUTANEOUS | Status: DC | PRN

## 2015-02-21 MED ORDER — PHENYLEPHRINE 40 MCG/ML (10ML) SYRINGE FOR IV PUSH (FOR BLOOD PRESSURE SUPPORT)
80.0000 ug | PREFILLED_SYRINGE | INTRAVENOUS | Status: DC | PRN
  Filled 2015-02-21: qty 20
  Filled 2015-02-21: qty 2

## 2015-02-21 MED ORDER — OXYTOCIN 40 UNITS IN LACTATED RINGERS INFUSION - SIMPLE MED: 62.5000 mL/h | INTRAVENOUS | Status: AC

## 2015-02-21 MED ORDER — OXYTOCIN 40 UNITS IN LACTATED RINGERS INFUSION - SIMPLE MED
1.0000 m[IU]/min | INTRAVENOUS | Status: DC
Start: 2015-02-21 — End: 2015-02-21
  Administered 2015-02-21: 17 m[IU]/min via INTRAVENOUS
  Administered 2015-02-21: 1 m[IU]/min via INTRAVENOUS
  Filled 2015-02-21: qty 1000

## 2015-02-21 MED ORDER — DIPHENHYDRAMINE HCL 50 MG/ML IJ SOLN: 12.5000 mg | INTRAMUSCULAR | Status: DC | PRN

## 2015-02-21 MED ORDER — ONDANSETRON HCL 4 MG/2ML IJ SOLN
4.0000 mg | INTRAMUSCULAR | Status: DC | PRN
Start: 2015-02-21 — End: 2015-02-23

## 2015-02-21 MED ORDER — DIPHENHYDRAMINE HCL 25 MG PO CAPS: 25.0000 mg | ORAL_CAPSULE | Freq: Four times a day (QID) | ORAL | Status: DC | PRN

## 2015-02-21 MED ORDER — OXYCODONE-ACETAMINOPHEN 5-325 MG PO TABS: 1.0000 | ORAL_TABLET | ORAL | Status: DC | PRN

## 2015-02-21 MED ORDER — ZOLPIDEM TARTRATE 5 MG PO TABS: 5.0000 mg | ORAL_TABLET | Freq: Every evening | ORAL | Status: DC | PRN

## 2015-02-21 MED ORDER — LIDOCAINE HCL (PF) 1 % IJ SOLN
30.0000 mL | INTRAMUSCULAR | Status: DC | PRN
  Filled 2015-02-21: qty 30

## 2015-02-21 MED ORDER — OXYCODONE-ACETAMINOPHEN 5-325 MG PO TABS: 2.0000 | ORAL_TABLET | ORAL | Status: DC | PRN

## 2015-02-21 MED ORDER — FENTANYL 2.5 MCG/ML BUPIVACAINE 1/10 % EPIDURAL INFUSION (WH - ANES)
14.0000 mL/h | INTRAMUSCULAR | Status: DC | PRN
  Filled 2015-02-21: qty 125

## 2015-02-21 MED ORDER — MEASLES, MUMPS & RUBELLA VAC ~~LOC~~ INJ
0.5000 mL | INJECTION | Freq: Once | SUBCUTANEOUS | Status: DC
  Filled 2015-02-21: qty 0.5

## 2015-02-21 MED ORDER — EPHEDRINE 5 MG/ML INJ
10.0000 mg | INTRAVENOUS | Status: DC | PRN
  Filled 2015-02-21: qty 2

## 2015-02-21 MED ORDER — FENTANYL 2.5 MCG/ML BUPIVACAINE 1/10 % EPIDURAL INFUSION (WH - ANES)
INTRAMUSCULAR | Status: DC | PRN
  Administered 2015-02-21: 14 mL/h via EPIDURAL

## 2015-02-21 MED ORDER — SIMETHICONE 80 MG PO CHEW: 80.0000 mg | CHEWABLE_TABLET | ORAL | Status: DC | PRN

## 2015-02-21 MED ORDER — TETANUS-DIPHTH-ACELL PERTUSSIS 5-2.5-18.5 LF-MCG/0.5 IM SUSP: 0.5000 mL | Freq: Once | INTRAMUSCULAR | Status: DC

## 2015-02-21 MED ORDER — LACTATED RINGERS IV SOLN
500.0000 mL | Freq: Once | INTRAVENOUS | Status: AC
  Administered 2015-02-21: 1000 mL via INTRAVENOUS

## 2015-02-21 MED ORDER — LIDOCAINE HCL (PF) 1 % IJ SOLN
INTRAMUSCULAR | Status: DC | PRN
  Administered 2015-02-21 (×2): 4 mL

## 2015-02-21 MED ORDER — DIBUCAINE 1 % RE OINT: 1.0000 "application " | TOPICAL_OINTMENT | RECTAL | Status: DC | PRN

## 2015-02-21 MED ORDER — DEXTROSE 5 % IV SOLN
5.0000 10*6.[IU] | Freq: Once | INTRAVENOUS | Status: AC
  Administered 2015-02-21: 5 10*6.[IU] via INTRAVENOUS
  Filled 2015-02-21: qty 5

## 2015-02-21 MED ORDER — OXYTOCIN BOLUS FROM INFUSION
500.0000 mL | INTRAVENOUS | Status: DC
  Administered 2015-02-21: 500 mL via INTRAVENOUS

## 2015-02-21 MED ORDER — BENZOCAINE-MENTHOL 20-0.5 % EX AERO: 1.0000 "application " | INHALATION_SPRAY | CUTANEOUS | Status: DC | PRN

## 2015-02-21 MED ORDER — PENICILLIN G POTASSIUM 5000000 UNITS IJ SOLR
2.5000 10*6.[IU] | INTRAVENOUS | Status: DC
  Administered 2015-02-21 (×2): 2.5 10*6.[IU] via INTRAVENOUS
  Filled 2015-02-21 (×5): qty 2.5

## 2015-02-21 MED ORDER — IBUPROFEN 600 MG PO TABS
600.0000 mg | ORAL_TABLET | Freq: Four times a day (QID) | ORAL | Status: DC
  Administered 2015-02-21 – 2015-02-23 (×6): 600 mg via ORAL
  Filled 2015-02-21 (×6): qty 1

## 2015-02-21 MED ORDER — LACTATED RINGERS IV SOLN
INTRAVENOUS | Status: DC
Start: 2015-02-21 — End: 2015-02-21
  Administered 2015-02-21 (×2): via INTRAVENOUS

## 2015-02-21 MED ORDER — ACETAMINOPHEN 325 MG PO TABS: 650.0000 mg | ORAL_TABLET | ORAL | Status: DC | PRN

## 2015-02-21 MED ORDER — WITCH HAZEL-GLYCERIN EX PADS: 1.0000 "application " | MEDICATED_PAD | CUTANEOUS | Status: DC | PRN

## 2015-02-21 MED ORDER — PHENYLEPHRINE 40 MCG/ML (10ML) SYRINGE FOR IV PUSH (FOR BLOOD PRESSURE SUPPORT)
80.0000 ug | PREFILLED_SYRINGE | INTRAVENOUS | Status: DC | PRN
  Filled 2015-02-21: qty 2

## 2015-02-21 MED ORDER — PRENATAL MULTIVITAMIN CH
1.0000 | ORAL_TABLET | Freq: Every day | ORAL | Status: DC
  Administered 2015-02-22: 1 via ORAL
  Filled 2015-02-21: qty 1

## 2015-02-21 MED ORDER — SENNOSIDES-DOCUSATE SODIUM 8.6-50 MG PO TABS
2.0000 | ORAL_TABLET | ORAL | Status: DC
  Administered 2015-02-21 – 2015-02-23 (×2): 2 via ORAL
  Filled 2015-02-21 (×2): qty 2

## 2015-02-21 MED ORDER — LACTATED RINGERS IV SOLN: INTRAVENOUS | Status: AC

## 2015-02-21 MED ORDER — LIDOCAINE-EPINEPHRINE (PF) 2 %-1:200000 IJ SOLN
INTRAMUSCULAR | Status: DC | PRN
  Administered 2015-02-21: 5 mL via EPIDURAL

## 2015-02-21 MED ORDER — LACTATED RINGERS IV SOLN: 500.0000 mL | INTRAVENOUS | Status: DC | PRN

## 2015-02-21 MED ORDER — ONDANSETRON HCL 4 MG PO TABS
4.0000 mg | ORAL_TABLET | ORAL | Status: DC | PRN
Start: 2015-02-21 — End: 2015-02-23

## 2015-02-21 MED ORDER — OXYTOCIN 40 UNITS IN LACTATED RINGERS INFUSION - SIMPLE MED
62.5000 mL/h | INTRAVENOUS | Status: DC
  Administered 2015-02-21: 62.5 mL/h via INTRAVENOUS

## 2015-02-21 NOTE — Progress Notes (Signed)
Patient ID: Alyssa Hubbard, female   DOB: 20-Aug-1991, 24 y.o.   MRN: [id number] Delivery note:  The pt became fully dilated and pushed well. She delivered a living female infant spontaneously LOA over an intact perineum.. The Apgars were 9 and 9 at 1 and 5 minutes. The baby may have hypospadius. The cord blood collection personnel was in attendance and the placenta delivered intact. On manual exam of the uterus it was unclear if there was remaining placental tissue or if the decidua was very prominent. I made many passes with ring forceps removing multiple fragments of tissue. I saved all of them for path exam If there are fragments of placenta then she will be at increased risk of PP bleeding. I will try to examine the placenta that has already been taken to the bank for cord collection. I offered to go to the OR and do a D&C but the pt and her husband preferred to observe for future bleeding EBL 400 cc's. There were no lacerations.

## 2015-02-21 NOTE — Progress Notes (Signed)
Patient ID: Alyssa Hubbard, female   DOB: 01/15/1991, 24 y.o.   MRN: [id number] Pt is on 1 mu/minute of pitocin and is having painless contractions She was checked by the RN and cervix was 2-3 cm 60% effaced and the vertex was - 2 station. She reports her daughter and she has an irritated eye with redness and lacrimation. I consulted pharmacy and best course is to observe . If it is viral in origin it will be self limited.

## 2015-02-21 NOTE — Progress Notes (Signed)
Patient ID: Alyssa Hubbard, female   DOB: Jun 29, 1991, 24 y.o.   MRN: [id number] Pitocin at 15 mu/minute and contractions are q 3-5 minutes but the pt does not feel them. Cervix is 4 cm 60 % effaced and the vertex is at - 2 station. AROM produced clear fluid.

## 2015-02-21 NOTE — Progress Notes (Signed)
Patient ID: Alyssa Hubbard, female   DOB: 1991-02-20, 24 y.o.   MRN: [id number] Pt has received an epidural and after the epidural was 6 cm.Recently she became more uncomfortable and the RN thought the cervix was 7-8 cm and now by my exam the cervix is 9+ cm 100 % effaced and the vertex is at 0/+1 station.

## 2015-02-21 NOTE — Anesthesia Procedure Notes (Signed)
Epidural Patient location during procedure: OB Start time: 02/21/2015 4:05 PM  Staffing Anesthesiologist: Lauretta Grill Performed by: anesthesiologist   Preanesthetic Checklist Completed: patient identified, site marked, surgical consent, pre-op evaluation, timeout performed, IV checked, risks and benefits discussed and monitors and equipment checked  Epidural Patient position: sitting Prep: site prepped and draped and DuraPrep Patient monitoring: continuous pulse ox and blood pressure Approach: midline Location: L3-L4 Injection technique: LOR saline  Needle:  Needle type: Tuohy  Needle gauge: 17 G Needle length: 9 cm and 9 Needle insertion depth: 7 cm Catheter type: closed end flexible Catheter size: 19 Gauge Catheter at skin depth: 12 cm Test dose: negative  Assessment Events: blood not aspirated, injection not painful, no injection resistance, negative IV test and no paresthesia  Additional Notes Patient identified. Risks/Benefits/Options discussed with patient including but not limited to bleeding, infection, nerve damage, paralysis, failed block, incomplete pain control, headache, blood pressure changes, nausea, vomiting, reactions to medication both or allergic, itching and postpartum back pain. Confirmed with bedside nurse the patient's most recent platelet count. Confirmed with patient that they are not currently taking any anticoagulation, have any bleeding history or any family history of bleeding disorders. Patient expressed understanding and wished to proceed. All questions were answered. Sterile technique was used throughout the entire procedure. Please see nursing notes for vital signs. Test dose was given through epidural catheter and negative prior to continuing to dose epidural or start infusion. Warning signs of high block given to the patient including shortness of breath, tingling/numbness in hands, complete motor block, or any concerning symptoms with instructions to  call for help. Patient was given instructions on fall risk and not to get out of bed. All questions and concerns addressed with instructions to call with any issues or inadequate analgesia.

## 2015-02-21 NOTE — Anesthesia Preprocedure Evaluation (Signed)
Anesthesia Evaluation  Patient identified by MRN, date of birth, ID band Patient awake    Reviewed: Allergy & Precautions, NPO status , Patient's Chart, lab work & pertinent test results  History of Anesthesia Complications Negative for: history of anesthetic complications  Airway Mallampati: II  TM Distance: >3 FB Neck ROM: Full    Dental no notable dental hx. (+) Dental Advisory Given   Pulmonary neg pulmonary ROS,  breath sounds clear to auscultation  Pulmonary exam normal       Cardiovascular negative cardio ROS  Rhythm:Regular Rate:Normal     Neuro/Psych negative neurological ROS  negative psych ROS   GI/Hepatic negative GI ROS, Neg liver ROS,   Endo/Other  obesity  Renal/GU negative Renal ROS  negative genitourinary   Musculoskeletal negative musculoskeletal ROS (+)   Abdominal   Peds negative pediatric ROS (+)  Hematology  (+) anemia ,   Anesthesia Other Findings   Reproductive/Obstetrics (+) Pregnancy                             Anesthesia Physical Anesthesia Plan  ASA: II  Anesthesia Plan: Epidural   Post-op Pain Management:    Induction:   Airway Management Planned:   Additional Equipment:   Intra-op Plan:   Post-operative Plan:   Informed Consent: I have reviewed the patients History and Physical, chart, labs and discussed the procedure including the risks, benefits and alternatives for the proposed anesthesia with the patient or authorized representative who has indicated his/her understanding and acceptance.   Dental advisory given  Plan Discussed with: CRNA  Anesthesia Plan Comments:         Anesthesia Quick Evaluation

## 2015-02-22 LAB — CBC
HCT: 30.4 % — ABNORMAL LOW (ref 36.0–46.0)
Hemoglobin: 9.8 g/dL — ABNORMAL LOW (ref 12.0–15.0)
MCH: 27 pg (ref 26.0–34.0)
MCHC: 32.2 g/dL (ref 30.0–36.0)
MCV: 83.7 fL (ref 78.0–100.0)
Platelets: 124 10*3/uL — ABNORMAL LOW (ref 150–400)
RBC: 3.63 MIL/uL — AB (ref 3.87–5.11)
RDW: 14.2 % (ref 11.5–15.5)
WBC: 8.7 10*3/uL (ref 4.0–10.5)

## 2015-02-22 LAB — HIV ANTIBODY (ROUTINE TESTING W REFLEX): HIV Screen 4th Generation wRfx: NONREACTIVE

## 2015-02-22 MED ORDER — ERYTHROMYCIN 5 MG/GM OP OINT
TOPICAL_OINTMENT | Freq: Four times a day (QID) | OPHTHALMIC | Status: DC
  Administered 2015-02-22 – 2015-02-23 (×2): via OPHTHALMIC
  Filled 2015-02-22: qty 3.5

## 2015-02-22 NOTE — Progress Notes (Signed)
Spoke with Dr. Willis Modena about pt c/o left eye watering, itching, and redness. New orders for erythromycin eye ointment x 4 daily. Alyssa Hubbard

## 2015-02-22 NOTE — Anesthesia Postprocedure Evaluation (Signed)
Anesthesia Post Note  Patient: Alyssa Hubbard  Procedure(s) Performed: * No procedures listed *  Anesthesia type: Epidural  Patient location: Mother/Baby  Post pain: Pain level controlled  Post assessment: Post-op Vital signs reviewed  Last Vitals:  Filed Vitals:   02/22/15 0005  BP: 125/65  Pulse: 102  Temp: 37 C  Resp: 20    Post vital signs: Reviewed  Level of consciousness:alert  Complications: No apparent anesthesia complications

## 2015-02-22 NOTE — Lactation Note (Signed)
This note was copied from the chart of [place]. Lactation Consultation Note: Mom has baby latched to breast when I went into room. Mom has blankets on baby- wrapped snug. Going off to sleep. Unwrapped baby and he nursed for about 5 minutes then came off and off to sleep. Mom reports he fed well after delivery and during the night. 3 hours old. Reports he is nursing much better than her first. Reviewed normal newborn behavior the first 24 hours and the second night. Reviewed feeding cues and encouraged to feed whenever she sees them. BF brochure given with resources for support after DC- OP appointments and BFSG. Asking about giving bottles- encouraged to wait 3 Vance Hochmuth- until baby is good at breastfeeding and good milk supply.  States she has lansinol pump for home. No further questions at present To call prn  Patient Name: Alyssa Hubbard [id number] Date: 02/22/2015 Reason for consult: Initial assessment   Maternal Data Formula Feeding for Exclusion: No Does the patient have breastfeeding experience prior to this delivery?: Yes  Feeding Feeding Type: Breast Fed Length of feed: 5 min  LATCH Score/Interventions Latch: Grasps breast easily, tongue down, lips flanged, rhythmical sucking.  Audible Swallowing: None  Type of Nipple: Everted at rest and after stimulation  Comfort (Breast/Nipple): Soft / non-tender     Hold (Positioning): No assistance needed to correctly position infant at breast.  LATCH Score: 8  Lactation Tools Discussed/Used     Consult Status Consult Status: Follow-up Date: 02/23/15 Follow-up type: In-patient    Truddie Crumble 02/22/2015, 12:28 PM

## 2015-02-22 NOTE — Progress Notes (Signed)
Patient ID: Alyssa Hubbard, female   DOB: 07/15/1991, 25 y.o.   MRN: [id number] #1 afebrile BP normal HGB stable

## 2015-02-22 NOTE — Progress Notes (Signed)
Rx for erythromycin ophthalmic ointment for conjunctivitis of left eye, her child also has conjunctivitis

## 2015-02-22 NOTE — Lactation Note (Signed)
This note was copied from the chart of [place]. Lactation Consultation Note Requesting supplementation d/t baby is wanting to BF frequently. Mom states when he isn't BF or sleeping, he is crying to get back on breast.  Mom holding baby to Rt. Breast in one piece sleeper, and wrapped in blanket. Baby's face looked flushed. Felt baby's face and neck, warm to touch w/gloves on. Asked mom to feel baby then feel the heat radiating off of the baby. Explained to warm and baby may be falling a sleep d/t "snacking" at breast. Encouraged to BF unwrapped and STS if possible.  Mom has good everted nipples. Hand expressed transitional milk. Asked mom why she wanted to supplement. Stated I felt like he was hungry because he was wanting to eat so much.  Mom had nipples pierced and milk squirting out of side when expressed. With vial, expressed transitional white milk that still had colostrum mixed d/t thickness and seeing some clear out of some nipple pours. Explained if she supplemented w/formula it will cut back your milk supply. Supplement with her breast milk. Hand pump given, vials and cup, syring.  Mom stills wants formula as back up. Alimentum given w/feeding instruction sheet for amount to be given according to hours of age of baby w/measuring cups.  Patient Name: Alyssa Hubbard QIONG'E Date: 02/22/2015 Reason for consult: Follow-up assessment   Maternal Data Formula Feeding for Exclusion: No Does the patient have breastfeeding experience prior to this delivery?: Yes  Feeding Feeding Type: Breast Fed Length of feed: 20 min (still BF)  LATCH Score/Interventions Latch: Grasps breast easily, tongue down, lips flanged, rhythmical sucking. Intervention(s): Adjust position;Assist with latch;Breast massage;Breast compression  Audible Swallowing: Spontaneous and intermittent Intervention(s): Alternate breast massage  Type of Nipple: Everted at rest and after stimulation  Comfort  (Breast/Nipple): Soft / non-tender     Hold (Positioning): Assistance needed to correctly position infant at breast and maintain latch. Intervention(s): Breastfeeding basics reviewed;Support Pillows;Position options;Skin to skin  LATCH Score: 9  Lactation Tools Discussed/Used     Consult Status Consult Status: Follow-up Date: 02/23/15 Follow-up type: In-patient    Theodoro Kalata 02/22/2015, 2:54 PM

## 2015-02-23 MED ORDER — FERROUS SULFATE 325 (65 FE) MG PO TBEC: 325.0000 mg | DELAYED_RELEASE_TABLET | Freq: Two times a day (BID) | ORAL | Status: DC

## 2015-02-23 MED ORDER — PRENATAL MULTIVITAMIN CH: 1.0000 | ORAL_TABLET | Freq: Every day | ORAL | Status: DC

## 2015-02-23 MED ORDER — IBUPROFEN 600 MG PO TABS: 600.0000 mg | ORAL_TABLET | Freq: Four times a day (QID) | ORAL | Status: DC | PRN

## 2015-02-23 MED ORDER — ERYTHROMYCIN 5 MG/GM OP OINT: TOPICAL_OINTMENT | Freq: Four times a day (QID) | OPHTHALMIC | Status: DC

## 2015-02-23 NOTE — Progress Notes (Signed)
Patient ID: Alyssa Hubbard, female   DOB: 1990/12/18, 24 y.o.   MRN: [id number] #2 afebrile BP normal no heavy bleeding For d/c. Pt advised to call her physician about her conjunctivitis

## 2015-02-23 NOTE — Lactation Note (Signed)
This note was copied from the chart of [place]. Lactation Consultation Note; Mother states that breastfeeding is going much better. She states that she has so much less pain than with her first child. Mother is also supplementing infant. Advised mother in treatment for severe engorgement. Reviewed Baby and me book,. Mother has a hand pump and states she has only used it once. She denies having any discomforts with use. Mother is active with [facility]. She doesn't have scheduled appt at this time. Mother advised to feed infant 8-12 times in 24 hours as well as with all feeding cues. Mother receptive to all teaching.   Patient Name: Alyssa Hubbard HUDJS'H Date: 02/23/2015 Reason for consult: Follow-up assessment   Maternal Data    Feeding    LATCH Score/Interventions                      Lactation Tools Discussed/Used     Consult Status Consult Status: Complete    Darla Lesches 02/23/2015, 10:29 AM

## 2015-02-23 NOTE — Progress Notes (Signed)
UR chart review completed.  

## 2015-02-23 NOTE — Discharge Summary (Signed)
NAMEJUSTINE, DINES NO.:  [id number]  MEDICAL RECORD NO.:  [id number]  LOCATION:  9121                          FACILITY:  [facility]  PHYSICIAN:  Lucille Passy. Ulanda Edison, M.D. DATE OF BIRTH:  Nov 30, 1990  DATE OF ADMISSION:  02/21/2015 DATE OF DISCHARGE:  02/23/2015                              DISCHARGE SUMMARY   This is a 24 year old black female, para 1-0-0-1, gravida 2, admitted for induction of labor at 39+ weeks gestation.  Ultrasound in early pregnancy was confirmed her due date.  Her anatomy ultrasound was normal except for a fibroid.  The patient decided against screening tests.  At the time of admission, she was placed on Pitocin.  She was 2 cm dilated. She reported a conjunctivitis in her left eye, and her daughter had the same condition.  The condition initially improved as if it might be a viral problem then it worsened, and she was placed on erythromycin before discharge.  Ultimately in the antepartum, the cervix reached 4 cm at 1:09 p.m., artificial rupture of the membranes produced clear fluid. She received an epidural.  It was 6 cm after the epidural.  The cervix then progressed fairly rapidly to full dilatation.  She delivered a living female infant spontaneously LOA over an intact perineum by Dr. Ulanda Edison.  Apgars were 9 and 9 at one and five minutes.  I thought the baby might have hypospadias.  On manual exam of the uterus, it was unclear if there was remaining placental tissue of the decidua was very prominent.  I made many passes with ring forceps removing multiple fragments of tissue.  Isolated all of them for Pap exam and explained to the patient that if it showed placenta that she is at increased risk of postpartum bleeding.  I offered the patient to go to the operating room and do a D and C, but the patient and her husband preferred to observe for future bleeding, there were about 400 mL blood loss.  Postpartum, the patient did very well.  She was  placed on erythromycin ointment for the conjunctivitis when it became worse.  Her initial hemoglobin was 10.3, hematocrit 31.7, white count 7400, platelet count 129,000.  RPR and HIV were negative.  Followup hemoglobin was 9.8, hematocrit 30.4, platelet count 124,000, white count 8700.  FINAL DIAGNOSES:  Intrauterine pregnancy, 39+ weeks, delivered LOA, conjunctivitis of left eye.  OPERATION:  Spontaneous delivery, LOA, no lacerations, manual removal with ring forceps of fragments of tissue that could have been decidual placenta.  FINAL CONDITION:  Improved.  INSTRUCTIONS:  Include our regular discharge instruction booklet.  The patient is advised to consult her medical doctor about the conjunctivitis.  She is advised to watch for heavy bleeding and call with any extremely heavy bleeding.  DISCHARGE MEDICATIONS:  Motrin 600 mg 30 tablets, 1 every 6 hours as needed for pain.  Ferrous sulfate 325 mg twice daily.  Continue prenatal vitamin pills.  FOLLOWUP:  Return to the office in 6 weeks for followup examination.     Lucille Passy. Ulanda Edison, M.D.     TFH/MEDQ  D:  02/23/2015  T:  02/23/2015  Job:  [id number]

## 2015-02-23 NOTE — Discharge Instructions (Signed)
booklet °

## 2015-07-01 ENCOUNTER — Inpatient Hospital Stay (HOSPITAL_COMMUNITY)
Admission: AD | Admit: 2015-07-01 | Discharge: 2015-07-01 | Disposition: A | Discharge status: Home / Self Care | Payer: Medicaid Other | Source: Ambulatory Visit | Attending: Obstetrics and Gynecology | Admitting: Obstetrics and Gynecology

## 2015-07-01 ENCOUNTER — Encounter (HOSPITAL_COMMUNITY): Payer: Self-pay | Admitting: *Deleted

## 2015-07-01 ENCOUNTER — Inpatient Hospital Stay (HOSPITAL_COMMUNITY): Payer: Medicaid Other

## 2015-07-01 DIAGNOSIS — O30001 Twin pregnancy, unspecified number of placenta and unspecified number of amniotic sacs, first trimester: Secondary | ICD-10-CM | POA: Diagnosis not present

## 2015-07-01 DIAGNOSIS — Z3A01 Less than 8 weeks gestation of pregnancy: Secondary | ICD-10-CM | POA: Insufficient documentation

## 2015-07-01 DIAGNOSIS — O4691 Antepartum hemorrhage, unspecified, first trimester: Secondary | ICD-10-CM

## 2015-07-01 DIAGNOSIS — O209 Hemorrhage in early pregnancy, unspecified: Secondary | ICD-10-CM

## 2015-07-01 DIAGNOSIS — O208 Other hemorrhage in early pregnancy: Secondary | ICD-10-CM | POA: Diagnosis not present

## 2015-07-01 LAB — URINALYSIS, ROUTINE W REFLEX MICROSCOPIC
BILIRUBIN URINE: NEGATIVE
Glucose, UA: NEGATIVE mg/dL
Ketones, ur: 15 mg/dL — AB
Leukocytes, UA: NEGATIVE
NITRITE: NEGATIVE
Protein, ur: NEGATIVE mg/dL
Specific Gravity, Urine: >1.03 — ABNORMAL HIGH (ref 1.005–1.030)
UROBILINOGEN UA: 0.2 mg/dL (ref 0.0–1.0)
pH: 5.5 (ref 5.0–8.0)

## 2015-07-01 LAB — POCT PREGNANCY, URINE: PREG TEST UR: POSITIVE — AB

## 2015-07-01 LAB — URINE MICROSCOPIC-ADD ON

## 2015-07-01 IMAGING — US US OB TRANSVAGINAL
1 series · 14 of 28 positions shown · non-contrast
Comparison: None.

CLINICAL DATA: Patient with vaginal bleeding.  First trimester.

EXAM:
TWIN OBSTETRIC <14WK US AND TRANSVAGINAL OB US

[Series 1: us ob comp less 14 wk · 83 acquisitions, 14 frames shown]
[im 4/83]
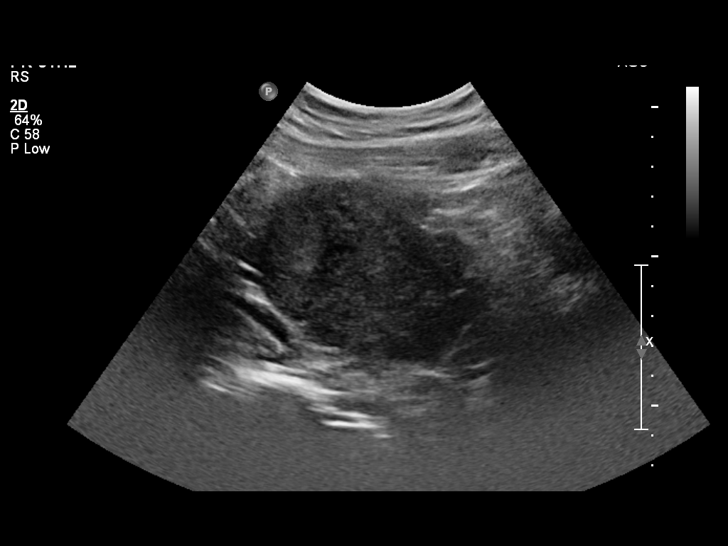
[im 10/83]
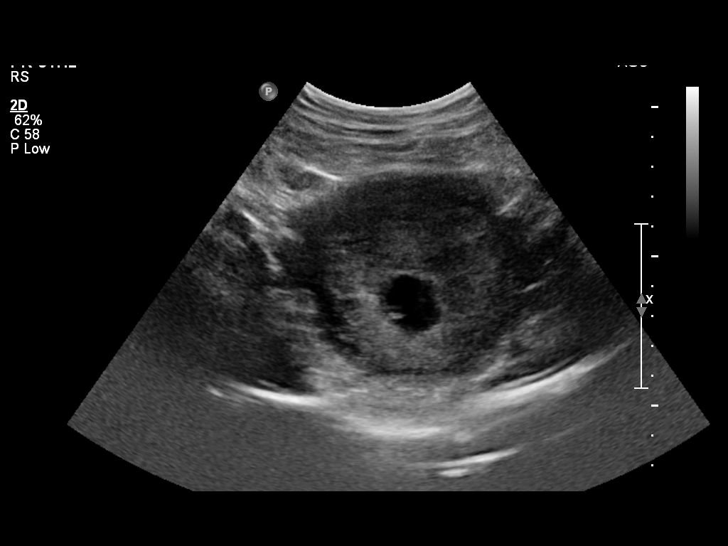
[im 16/83]
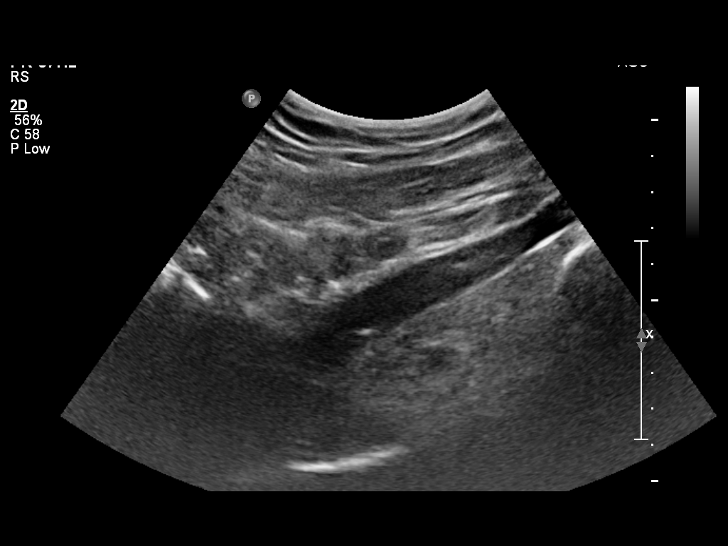
[im 22/83]
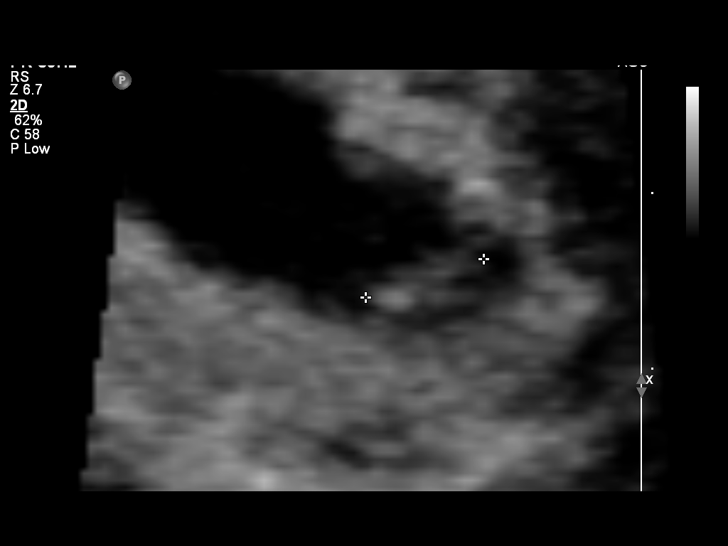
[im 28/83]
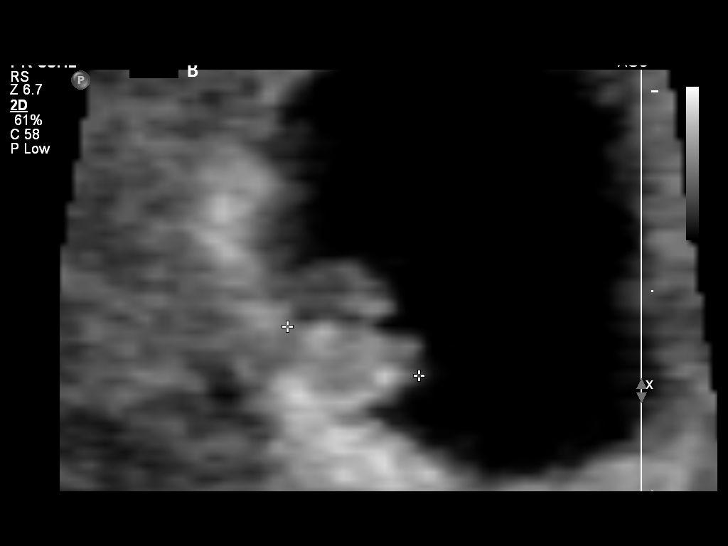
[im 34/83]
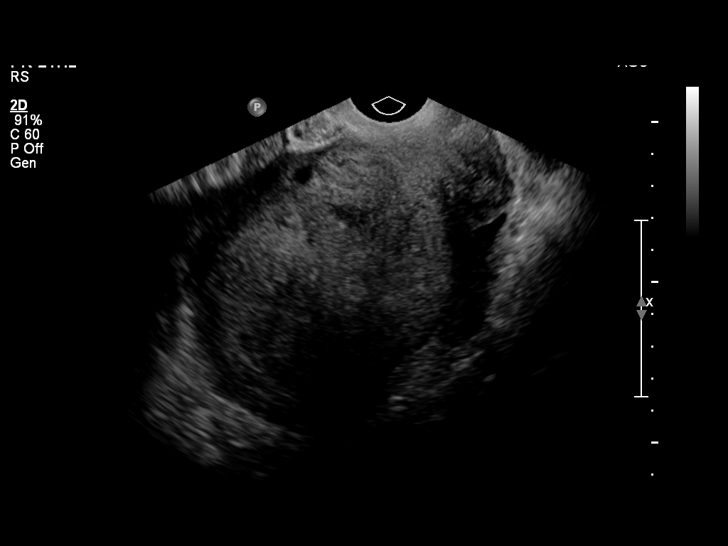
[im 40/83]
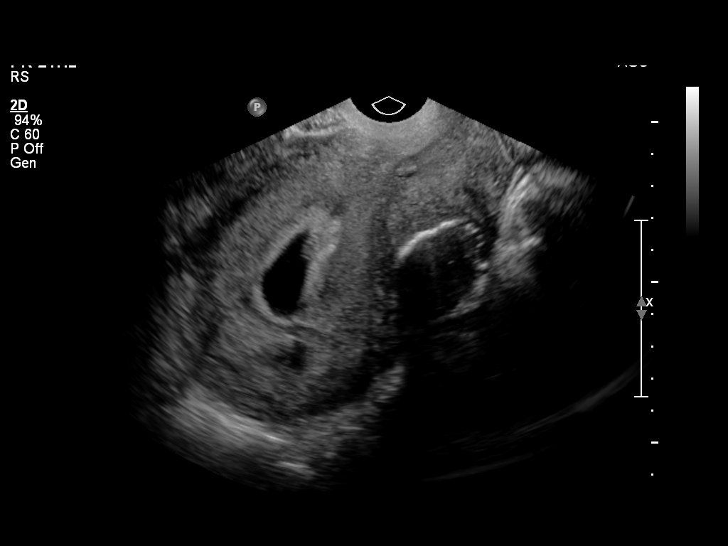
[im 46/83]
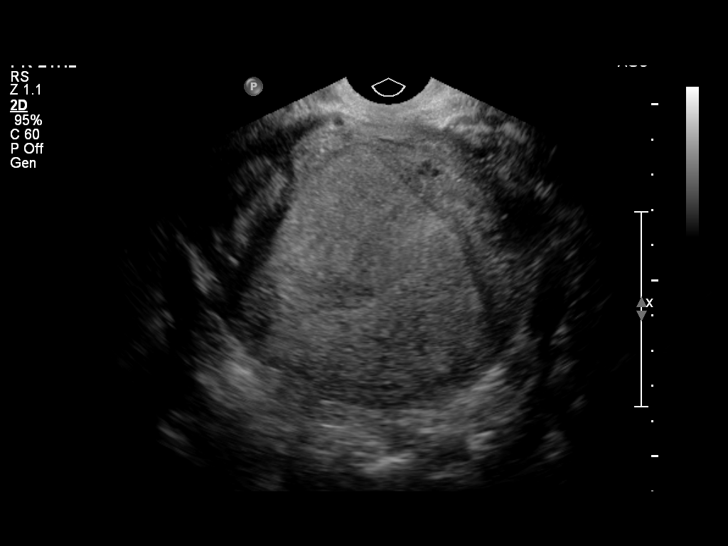
[im 52/83]
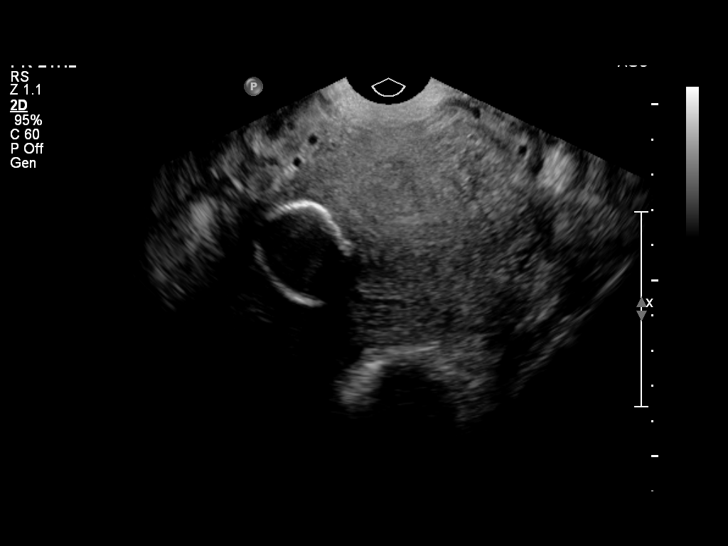
[im 58/83]
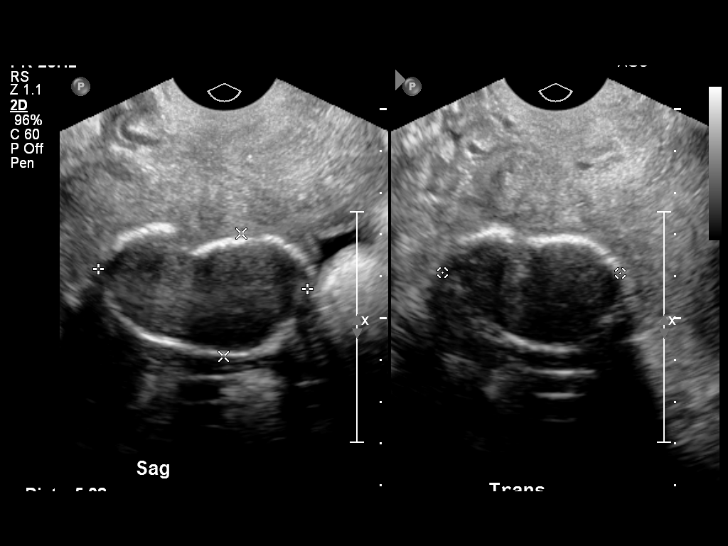
[im 64/83]
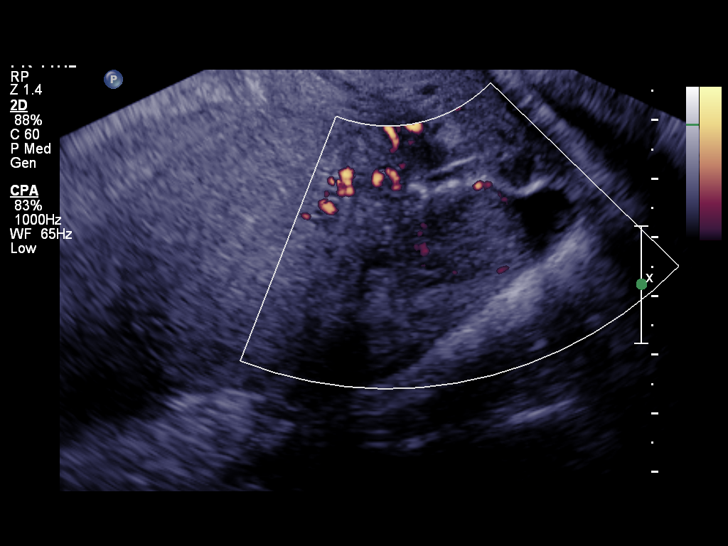
[im 70/83]
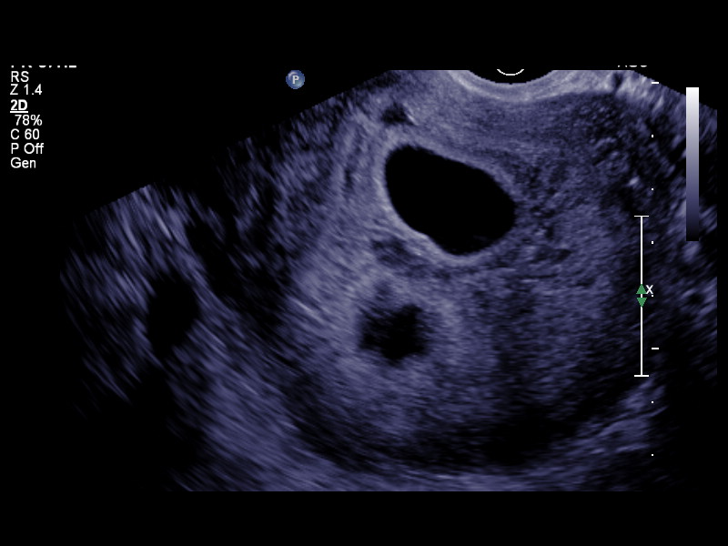
[im 76/83]
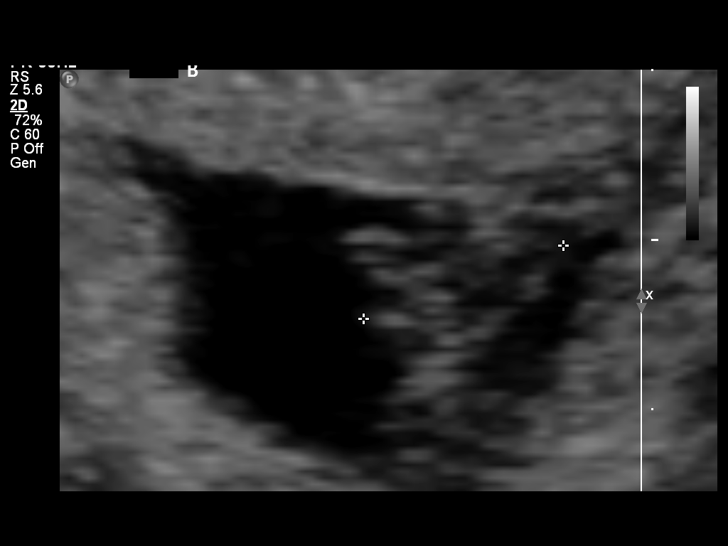
[im 83/83]
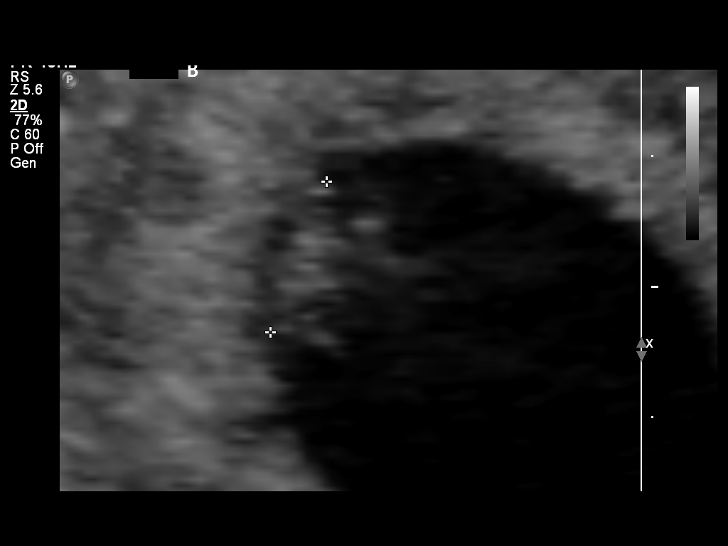

[14 of 28 positions shown; findings below may reference images not displayed]

FINDINGS: TWIN 1

Intrauterine gestational sac: Visualized/normal in shape.

Yolk sac:  Present

Embryo:  Present

Cardiac Activity: Present

Heart Rate: 117 bpm

CRL:  6.7  mm   6 w 4 d                  US EDC: [DATE]

TWIN 2

Intrauterine gestational sac: Visualized/normal in shape.

Yolk sac:  Present

Embryo:  Present

Cardiac Activity: Present

Heart Rate: 117 bpm

CRL:  6.3 mm  mm   6 w 4 d                  US EDC: [DATE]

Maternal uterus/adnexae: The right and left ovary are normal. There
is a moderate subchorionic hemorrhage measuring up to 3.7 cm. Two
fibroids are demonstrated with the largest measuring 5.0 cm. This
fibroid is peripherally calcified.
IMPRESSION: Live intrauterine twin gestation.

Moderate subchorionic hemorrhage.

Fibroid uterus.

## 2015-07-01 NOTE — Progress Notes (Signed)
Butch Penny NP in to discuss test result and d/c plan. Written and verbal d/c instructions given and understanding voiced.

## 2015-07-01 NOTE — MAU Note (Signed)
Pt presents to MAU with complaints of vaginal spotting on and off since she found out she was pregnancy July the 14th. Pt had an ultrasound on Wednesday.

## 2015-07-01 NOTE — MAU Provider Note (Signed)
History     CSN: [id number]  Arrival date and time: 07/01/15 1700   First Provider Initiated Contact with Patient 07/01/15 1728      Chief Complaint  Patient presents with  . Vaginal Bleeding   HPI Alyssa Hubbard 24 y.o. [redacted]w[redacted]d  Comes to MAU today with vaginal bleeding.  Is not having any cramping.  Denies any problems with itching or burning.  Has had one ultrasound in the office and has known twins.  Is worried as she has had intermittent spotting which is continuing.  Is concerned about miscarriage.  OB History    Gravida Para Term Preterm AB TAB SAB Ectopic Multiple Living   3 2 2  0 0 0 0 0 0 2      Past Medical History  Diagnosis Date  . Fibroid   . Gall stones   . Anemia     states "supposed to be on Iron, but has not been taking it".    Past Surgical History  Procedure Laterality Date  . No past surgeries      Family History  Problem Relation Age of Onset  . Asthma Neg Hx   . Arthritis Neg Hx   . Alcohol abuse Neg Hx   . Birth defects Neg Hx   . Cancer Neg Hx   . COPD Neg Hx   . Depression Neg Hx   . Diabetes Neg Hx   . Drug abuse Neg Hx   . Early death Neg Hx   . Hearing loss Neg Hx   . Heart disease Neg Hx   . Hyperlipidemia Neg Hx   . Hypertension Neg Hx   . Kidney disease Neg Hx   . Learning disabilities Neg Hx   . Mental illness Neg Hx   . Mental retardation Neg Hx   . Miscarriages / Stillbirths Neg Hx   . Stroke Neg Hx   . Vision loss Neg Hx   . Varicose Veins Neg Hx     History  Substance Use Topics  . Smoking status: Never Smoker   . Smokeless tobacco: Never Used  . Alcohol Use: No    Allergies: No Known Allergies  Prescriptions prior to admission  Medication Sig Dispense Refill Last Dose  . erythromycin ophthalmic ointment Place into the left eye 4 (four) times daily. 3.5 g 0   . ferrous sulfate 325 (65 FE) MG EC tablet Take 1 tablet (325 mg total) by mouth 2 (two) times daily. 60 tablet 3   . ibuprofen (ADVIL,MOTRIN) 600 MG  tablet Take 1 tablet (600 mg total) by mouth every 6 (six) hours as needed. 30 tablet 0   . Prenatal Vit-Fe Fumarate-FA (PRENATAL MULTIVITAMIN) TABS tablet Take 1 tablet by mouth daily at 12 noon. 30 tablet 10     ROS Physical Exam   Blood pressure 148/87, pulse 93, resp. rate 18, height 5\' 7"  (1.702 m), weight 247 lb (112.038 kg), last menstrual period 05/15/2015, unknown if currently breastfeeding.  Physical Exam  MAU Course  Procedures Results for orders placed or performed during the hospital encounter of 07/01/15 (from the past 24 hour(s))  Urinalysis, Routine w reflex microscopic (not at [facility])     Status: Abnormal   Collection Time: 07/01/15  5:10 PM  Result Value Ref Range   Color, Urine YELLOW YELLOW   APPearance CLEAR CLEAR   Specific Gravity, Urine >1.030 (H) 1.005 - 1.030   pH 5.5 5.0 - 8.0   Glucose, UA NEGATIVE NEGATIVE mg/dL  Hgb urine dipstick TRACE (A) NEGATIVE   Bilirubin Urine NEGATIVE NEGATIVE   Ketones, ur 15 (A) NEGATIVE mg/dL   Protein, ur NEGATIVE NEGATIVE mg/dL   Urobilinogen, UA 0.2 0.0 - 1.0 mg/dL   Nitrite NEGATIVE NEGATIVE   Leukocytes, UA NEGATIVE NEGATIVE  Urine microscopic-add on     Status: Abnormal   Collection Time: 07/01/15  5:10 PM  Result Value Ref Range   Squamous Epithelial / LPF RARE RARE   WBC, UA 0-2 <3 WBC/hpf   RBC / HPF 0-2 <3 RBC/hpf   Bacteria, UA FEW (A) RARE  Pregnancy, urine POC     Status: Abnormal   Collection Time: 07/01/15  5:15 PM  Result Value Ref Range   Preg Test, Ur POSITIVE (A) NEGATIVE    MDM Preliminary report of ultrasound reviewed in media tab. - heart beats from 2 babies seen.  Moderate subchorionic hemorrhage  Assessment and Plan  Vaginal bleeding in first trimester due to moderate subchorionic hemorrhage Twin gestation  Plan Pelvic Rest until seen by MD Keep your appointments as scheduled Call your doctor if your bleeding worsens. Drink at least 8 8-oz glasses of water every day. Take Tylenol 325  mg 2 tablets by mouth every 4 hours if needed for pain.  Alyssa Hubbard 07/01/2015, 5:31 PM

## 2015-07-01 NOTE — Discharge Instructions (Signed)
Pelvic Rest until seen by MD Keep your appointments as scheduled Call your doctor if your bleeding worsens. Drink at least 8 8-oz glasses of water every day. Take Tylenol 325 mg 2 tablets by mouth every 4 hours if needed for pain.

## 2015-07-20 LAB — OB RESULTS CONSOLE ANTIBODY SCREEN: Antibody Screen: NEGATIVE

## 2015-07-20 LAB — OB RESULTS CONSOLE GC/CHLAMYDIA
Chlamydia: NEGATIVE
GC PROBE AMP, GENITAL: NEGATIVE

## 2015-07-20 LAB — OB RESULTS CONSOLE RPR: RPR: NONREACTIVE

## 2015-07-20 LAB — OB RESULTS CONSOLE HIV ANTIBODY (ROUTINE TESTING): HIV: NONREACTIVE

## 2015-07-20 LAB — OB RESULTS CONSOLE HEPATITIS B SURFACE ANTIGEN: HEP B S AG: NEGATIVE

## 2015-07-20 LAB — OB RESULTS CONSOLE ABO/RH: RH Type: POSITIVE

## 2015-07-20 LAB — OB RESULTS CONSOLE RUBELLA ANTIBODY, IGM: RUBELLA: IMMUNE

## 2015-11-27 DIAGNOSIS — Z5189 Encounter for other specified aftercare: Secondary | ICD-10-CM

## 2015-11-27 HISTORY — DX: Encounter for other specified aftercare: Z51.89

## 2016-01-20 LAB — OB RESULTS CONSOLE GBS: GBS: POSITIVE

## 2016-01-26 ENCOUNTER — Telehealth (HOSPITAL_COMMUNITY): Payer: Self-pay | Admitting: *Deleted

## 2016-01-26 ENCOUNTER — Encounter (HOSPITAL_COMMUNITY): Payer: Self-pay | Admitting: *Deleted

## 2016-01-26 NOTE — Telephone Encounter (Signed)
Preadmission screen  

## 2016-02-02 ENCOUNTER — Inpatient Hospital Stay (HOSPITAL_COMMUNITY): Payer: Medicaid Other | Admitting: Anesthesiology

## 2016-02-02 ENCOUNTER — Encounter (HOSPITAL_COMMUNITY): Payer: Self-pay | Admitting: *Deleted

## 2016-02-02 ENCOUNTER — Inpatient Hospital Stay (HOSPITAL_COMMUNITY)
Admission: AD | Admit: 2016-02-02 | Discharge: 2016-02-05 | DRG: 765 | Disposition: A | Discharge status: Home / Self Care | Payer: Medicaid Other | Source: Ambulatory Visit | Attending: Obstetrics and Gynecology | Admitting: Obstetrics and Gynecology

## 2016-02-02 ENCOUNTER — Encounter (HOSPITAL_COMMUNITY)
Admission: AD | Disposition: A | Discharge status: Home / Self Care | Payer: Self-pay | Source: Ambulatory Visit | Attending: Obstetrics and Gynecology

## 2016-02-02 DIAGNOSIS — Z3A37 37 weeks gestation of pregnancy: Secondary | ICD-10-CM

## 2016-02-02 DIAGNOSIS — O99824 Streptococcus B carrier state complicating childbirth: Secondary | ICD-10-CM | POA: Diagnosis present

## 2016-02-02 DIAGNOSIS — IMO0001 Reserved for inherently not codable concepts without codable children: Secondary | ICD-10-CM

## 2016-02-02 DIAGNOSIS — O30043 Twin pregnancy, dichorionic/diamniotic, third trimester: Secondary | ICD-10-CM | POA: Diagnosis present

## 2016-02-02 DIAGNOSIS — O458X3 Other premature separation of placenta, third trimester: Principal | ICD-10-CM | POA: Diagnosis not present

## 2016-02-02 DIAGNOSIS — O30009 Twin pregnancy, unspecified number of placenta and unspecified number of amniotic sacs, unspecified trimester: Secondary | ICD-10-CM

## 2016-02-02 HISTORY — DX: Gestational (pregnancy-induced) hypertension without significant proteinuria, unspecified trimester: O13.9

## 2016-02-02 LAB — CBC
HCT: 27.3 % — ABNORMAL LOW (ref 36.0–46.0)
HCT: 22.9 % — ABNORMAL LOW (ref 36.0–46.0)
Hemoglobin: 8.3 g/dL — ABNORMAL LOW (ref 12.0–15.0)
Hemoglobin: 7.2 g/dL — ABNORMAL LOW (ref 12.0–15.0)
MCH: 22.3 pg — AB (ref 26.0–34.0)
MCH: 23.9 pg — AB (ref 26.0–34.0)
MCHC: 30.4 g/dL (ref 30.0–36.0)
MCHC: 31.4 g/dL (ref 30.0–36.0)
MCV: 73.2 fL — AB (ref 78.0–100.0)
MCV: 76.1 fL — AB (ref 78.0–100.0)
PLATELETS: 120 10*3/uL — AB (ref 150–400)
PLATELETS: 114 10*3/uL — AB (ref 150–400)
RBC: 3.73 MIL/uL — ABNORMAL LOW (ref 3.87–5.11)
RBC: 3.01 MIL/uL — AB (ref 3.87–5.11)
RDW: 15.7 % — AB (ref 11.5–15.5)
RDW: 16.7 % — AB (ref 11.5–15.5)
WBC: 9.8 10*3/uL (ref 4.0–10.5)
WBC: 12.6 10*3/uL — ABNORMAL HIGH (ref 4.0–10.5)

## 2016-02-02 LAB — CBC WITH DIFFERENTIAL/PLATELET
BASOS ABS: 0 10*3/uL (ref 0.0–0.1)
BASOS PCT: 0 %
EOS ABS: 0 10*3/uL (ref 0.0–0.7)
EOS PCT: 0 %
HCT: 28.5 % — ABNORMAL LOW (ref 36.0–46.0)
Hemoglobin: 9.3 g/dL — ABNORMAL LOW (ref 12.0–15.0)
Lymphocytes Relative: 6 %
Lymphs Abs: 0.9 10*3/uL (ref 0.7–4.0)
MCH: 24.8 pg — AB (ref 26.0–34.0)
MCHC: 32.6 g/dL (ref 30.0–36.0)
MCV: 76 fL — AB (ref 78.0–100.0)
Monocytes Absolute: 0.5 10*3/uL (ref 0.1–1.0)
Monocytes Relative: 4 %
NEUTROS PCT: 90 %
Neutro Abs: 12.1 10*3/uL — ABNORMAL HIGH (ref 1.7–7.7)
PLATELETS: 158 10*3/uL (ref 150–400)
RBC: 3.75 MIL/uL — AB (ref 3.87–5.11)
RDW: 16.6 % — AB (ref 11.5–15.5)
WBC: 13.4 10*3/uL — AB (ref 4.0–10.5)

## 2016-02-02 LAB — DIC (DISSEMINATED INTRAVASCULAR COAGULATION)PANEL
D-Dimer, Quant: 2 ug/mL-FEU — ABNORMAL HIGH (ref 0.00–0.50)
Fibrinogen: 384 mg/dL (ref 204–475)
INR: 1.23 (ref 0.00–1.49)
Platelets: 104 10*3/uL — ABNORMAL LOW (ref 150–400)
Prothrombin Time: 15.6 seconds — ABNORMAL HIGH (ref 11.6–15.2)
aPTT: 25 seconds (ref 24–37)

## 2016-02-02 LAB — ELECTROLYTE PANEL
Anion gap: 8 (ref 5–15)
CHLORIDE: 108 mmol/L (ref 101–111)
CO2: 19 mmol/L — ABNORMAL LOW (ref 22–32)
POTASSIUM: 4.6 mmol/L (ref 3.5–5.1)
SODIUM: 135 mmol/L (ref 135–145)

## 2016-02-02 LAB — PROTIME-INR
INR: 1.13 (ref 0.00–1.49)
PROTHROMBIN TIME: 14.6 s (ref 11.6–15.2)

## 2016-02-02 LAB — DIC (DISSEMINATED INTRAVASCULAR COAGULATION) PANEL: SMEAR REVIEW: NONE SEEN

## 2016-02-02 LAB — MASSIVE TRANSFUSION PROTOCOL ORDER (BLOOD BANK NOTIFICATION)

## 2016-02-02 LAB — RPR: RPR Ser Ql: NONREACTIVE

## 2016-02-02 LAB — FIBRINOGEN: Fibrinogen: 431 mg/dL (ref 204–475)

## 2016-02-02 LAB — APTT: aPTT: 25 seconds (ref 24–37)

## 2016-02-02 LAB — PREPARE RBC (CROSSMATCH)

## 2016-02-02 SURGERY — Surgical Case
Anesthesia: Epidural

## 2016-02-02 MED ORDER — DEXAMETHASONE SODIUM PHOSPHATE 4 MG/ML IJ SOLN
INTRAMUSCULAR | Status: AC
  Filled 2016-02-02: qty 1

## 2016-02-02 MED ORDER — NALBUPHINE HCL 10 MG/ML IJ SOLN: 5.0000 mg | INTRAMUSCULAR | Status: DC | PRN

## 2016-02-02 MED ORDER — FENTANYL 2.5 MCG/ML BUPIVACAINE 1/10 % EPIDURAL INFUSION (WH - ANES)
INTRAMUSCULAR | Status: AC
  Administered 2016-02-02: 14 mL/h via EPIDURAL
  Filled 2016-02-02: qty 125

## 2016-02-02 MED ORDER — PROMETHAZINE HCL 25 MG/ML IJ SOLN: 6.2500 mg | INTRAMUSCULAR | Status: DC | PRN

## 2016-02-02 MED ORDER — ONDANSETRON HCL 4 MG/2ML IJ SOLN
4.0000 mg | Freq: Three times a day (TID) | INTRAMUSCULAR | Status: DC | PRN
Start: 2016-02-02 — End: 2016-02-05

## 2016-02-02 MED ORDER — SODIUM CHLORIDE 0.9 % IV SOLN
2.0000 g | Freq: Once | INTRAVENOUS | Status: AC
  Administered 2016-02-02: 2 g via INTRAVENOUS
  Filled 2016-02-02: qty 2000

## 2016-02-02 MED ORDER — FENTANYL CITRATE (PF) 100 MCG/2ML IJ SOLN
25.0000 ug | INTRAMUSCULAR | Status: DC | PRN
  Administered 2016-02-02: 50 ug via INTRAVENOUS

## 2016-02-02 MED ORDER — IBUPROFEN 600 MG PO TABS
600.0000 mg | ORAL_TABLET | Freq: Four times a day (QID) | ORAL | Status: DC
  Administered 2016-02-03 – 2016-02-05 (×10): 600 mg via ORAL
  Filled 2016-02-02 (×10): qty 1

## 2016-02-02 MED ORDER — ONDANSETRON HCL 4 MG PO TABS: 4.0000 mg | ORAL_TABLET | ORAL | Status: DC | PRN

## 2016-02-02 MED ORDER — LIDOCAINE HCL (PF) 1 % IJ SOLN
30.0000 mL | INTRAMUSCULAR | Status: DC | PRN
  Filled 2016-02-02: qty 30

## 2016-02-02 MED ORDER — ACETAMINOPHEN 500 MG PO TABS
1000.0000 mg | ORAL_TABLET | Freq: Four times a day (QID) | ORAL | Status: AC
  Administered 2016-02-03 (×2): 1000 mg via ORAL
  Filled 2016-02-02 (×2): qty 2

## 2016-02-02 MED ORDER — LANOLIN HYDROUS EX OINT: TOPICAL_OINTMENT | CUTANEOUS | Status: DC | PRN

## 2016-02-02 MED ORDER — MISOPROSTOL 200 MCG PO TABS
ORAL_TABLET | ORAL | Status: AC
  Filled 2016-02-02: qty 4

## 2016-02-02 MED ORDER — OXYCODONE-ACETAMINOPHEN 5-325 MG PO TABS: 1.0000 | ORAL_TABLET | ORAL | Status: DC | PRN

## 2016-02-02 MED ORDER — SODIUM CHLORIDE 0.9 % IV SOLN
INTRAVENOUS | Status: DC | PRN
  Administered 2016-02-02: 13:00:00 via INTRAVENOUS

## 2016-02-02 MED ORDER — ZOLPIDEM TARTRATE 5 MG PO TABS: 5.0000 mg | ORAL_TABLET | Freq: Every evening | ORAL | Status: DC | PRN

## 2016-02-02 MED ORDER — PHENYLEPHRINE HCL 10 MG/ML IJ SOLN
INTRAMUSCULAR | Status: DC | PRN
  Administered 2016-02-02 (×2): 80 ug via INTRAVENOUS

## 2016-02-02 MED ORDER — MEPERIDINE HCL 25 MG/ML IJ SOLN: 6.2500 mg | INTRAMUSCULAR | Status: DC | PRN

## 2016-02-02 MED ORDER — DEXAMETHASONE SODIUM PHOSPHATE 10 MG/ML IJ SOLN
INTRAMUSCULAR | Status: DC | PRN
  Administered 2016-02-02: 4 mg via INTRAVENOUS

## 2016-02-02 MED ORDER — LACTATED RINGERS IV SOLN
INTRAVENOUS | Status: DC | PRN
  Administered 2016-02-02 (×2): via INTRAVENOUS

## 2016-02-02 MED ORDER — WITCH HAZEL-GLYCERIN EX PADS: 1.0000 "application " | MEDICATED_PAD | CUTANEOUS | Status: DC | PRN

## 2016-02-02 MED ORDER — LACTATED RINGERS IV SOLN
500.0000 mL | Freq: Once | INTRAVENOUS | Status: AC
  Administered 2016-02-02: 1000 mL via INTRAVENOUS

## 2016-02-02 MED ORDER — EPHEDRINE 5 MG/ML INJ: 10.0000 mg | INTRAVENOUS | Status: DC | PRN

## 2016-02-02 MED ORDER — FENTANYL 2.5 MCG/ML BUPIVACAINE 1/10 % EPIDURAL INFUSION (WH - ANES)
14.0000 mL/h | INTRAMUSCULAR | Status: DC | PRN
  Administered 2016-02-02: 14 mL/h via EPIDURAL

## 2016-02-02 MED ORDER — DIPHENHYDRAMINE HCL 25 MG PO CAPS: 25.0000 mg | ORAL_CAPSULE | Freq: Four times a day (QID) | ORAL | Status: DC | PRN

## 2016-02-02 MED ORDER — LIDOCAINE-EPINEPHRINE (PF) 2 %-1:200000 IJ SOLN
INTRAMUSCULAR | Status: AC
  Filled 2016-02-02: qty 20

## 2016-02-02 MED ORDER — LIDOCAINE HCL (PF) 1 % IJ SOLN
INTRAMUSCULAR | Status: DC | PRN
  Administered 2016-02-02 (×2): 5 mL

## 2016-02-02 MED ORDER — ACETAMINOPHEN 325 MG PO TABS: 650.0000 mg | ORAL_TABLET | ORAL | Status: DC | PRN

## 2016-02-02 MED ORDER — SIMETHICONE 80 MG PO CHEW
80.0000 mg | CHEWABLE_TABLET | ORAL | Status: DC | PRN
  Administered 2016-02-03 – 2016-02-04 (×2): 80 mg via ORAL
  Filled 2016-02-02 (×2): qty 1

## 2016-02-02 MED ORDER — PHENYLEPHRINE 40 MCG/ML (10ML) SYRINGE FOR IV PUSH (FOR BLOOD PRESSURE SUPPORT): 80.0000 ug | PREFILLED_SYRINGE | INTRAVENOUS | Status: DC | PRN

## 2016-02-02 MED ORDER — SODIUM CHLORIDE 0.9% FLUSH
INTRAVENOUS | Status: AC
  Filled 2016-02-02: qty 3

## 2016-02-02 MED ORDER — NALOXONE HCL 2 MG/2ML IJ SOSY
1.0000 ug/kg/h | PREFILLED_SYRINGE | INTRAMUSCULAR | Status: DC | PRN
  Filled 2016-02-02: qty 2

## 2016-02-02 MED ORDER — PHENYLEPHRINE 40 MCG/ML (10ML) SYRINGE FOR IV PUSH (FOR BLOOD PRESSURE SUPPORT)
PREFILLED_SYRINGE | INTRAVENOUS | Status: AC
  Filled 2016-02-02: qty 20

## 2016-02-02 MED ORDER — FLEET ENEMA 7-19 GM/118ML RE ENEM: 1.0000 | ENEMA | RECTAL | Status: DC | PRN

## 2016-02-02 MED ORDER — DIPHENHYDRAMINE HCL 50 MG/ML IJ SOLN: 12.5000 mg | INTRAMUSCULAR | Status: DC | PRN

## 2016-02-02 MED ORDER — SODIUM CHLORIDE 0.9 % IV SOLN: Freq: Once | INTRAVENOUS | Status: DC

## 2016-02-02 MED ORDER — SCOPOLAMINE 1 MG/3DAYS TD PT72: 1.0000 | MEDICATED_PATCH | Freq: Once | TRANSDERMAL | Status: DC

## 2016-02-02 MED ORDER — FENTANYL CITRATE (PF) 100 MCG/2ML IJ SOLN
INTRAMUSCULAR | Status: AC
  Filled 2016-02-02: qty 2

## 2016-02-02 MED ORDER — MEPERIDINE HCL 25 MG/ML IJ SOLN
INTRAMUSCULAR | Status: DC | PRN
  Administered 2016-02-02: 12.5 mg via INTRAVENOUS

## 2016-02-02 MED ORDER — PHENYLEPHRINE 40 MCG/ML (10ML) SYRINGE FOR IV PUSH (FOR BLOOD PRESSURE SUPPORT)
PREFILLED_SYRINGE | INTRAVENOUS | Status: AC
  Filled 2016-02-02: qty 10

## 2016-02-02 MED ORDER — ONDANSETRON HCL 4 MG/2ML IJ SOLN: 4.0000 mg | Freq: Four times a day (QID) | INTRAMUSCULAR | Status: DC | PRN

## 2016-02-02 MED ORDER — PRENATAL MULTIVITAMIN CH
1.0000 | ORAL_TABLET | Freq: Every day | ORAL | Status: DC
  Administered 2016-02-03 – 2016-02-04 (×2): 1 via ORAL
  Filled 2016-02-02 (×2): qty 1

## 2016-02-02 MED ORDER — FENTANYL 2.5 MCG/ML BUPIVACAINE 1/10 % EPIDURAL INFUSION (WH - ANES)
14.0000 mL/h | INTRAMUSCULAR | Status: DC | PRN
Start: 2016-02-02 — End: 2016-02-02

## 2016-02-02 MED ORDER — ONDANSETRON HCL 4 MG/2ML IJ SOLN: 4.0000 mg | INTRAMUSCULAR | Status: DC | PRN

## 2016-02-02 MED ORDER — BENZOCAINE-MENTHOL 20-0.5 % EX AERO: 1.0000 "application " | INHALATION_SPRAY | CUTANEOUS | Status: DC | PRN

## 2016-02-02 MED ORDER — LIDOCAINE-EPINEPHRINE (PF) 2 %-1:200000 IJ SOLN
INTRAMUSCULAR | Status: DC | PRN
  Administered 2016-02-02: 5 mL via EPIDURAL
  Administered 2016-02-02: 3 mL via EPIDURAL
  Administered 2016-02-02: 5 mL via EPIDURAL

## 2016-02-02 MED ORDER — TETANUS-DIPHTH-ACELL PERTUSSIS 5-2.5-18.5 LF-MCG/0.5 IM SUSP
0.5000 mL | Freq: Once | INTRAMUSCULAR | Status: DC
  Filled 2016-02-02: qty 0.5

## 2016-02-02 MED ORDER — LACTATED RINGERS IV SOLN
INTRAVENOUS | Status: DC
  Administered 2016-02-02 (×3): via INTRAVENOUS

## 2016-02-02 MED ORDER — OXYTOCIN BOLUS FROM INFUSION: 500.0000 mL | INTRAVENOUS | Status: DC

## 2016-02-02 MED ORDER — OXYTOCIN 10 UNIT/ML IJ SOLN
40.0000 [IU] | INTRAVENOUS | Status: DC | PRN
  Administered 2016-02-02 (×2): 20 [IU] via INTRAVENOUS

## 2016-02-02 MED ORDER — SENNOSIDES-DOCUSATE SODIUM 8.6-50 MG PO TABS
2.0000 | ORAL_TABLET | ORAL | Status: DC
  Administered 2016-02-03 – 2016-02-04 (×3): 2 via ORAL
  Filled 2016-02-02 (×3): qty 2

## 2016-02-02 MED ORDER — SCOPOLAMINE 1 MG/3DAYS TD PT72
MEDICATED_PATCH | TRANSDERMAL | Status: AC
  Filled 2016-02-02: qty 1

## 2016-02-02 MED ORDER — LACTATED RINGERS IV SOLN
500.0000 mL | INTRAVENOUS | Status: DC | PRN
  Administered 2016-02-02: 1000 mL via INTRAVENOUS

## 2016-02-02 MED ORDER — NALOXONE HCL 0.4 MG/ML IJ SOLN: 0.4000 mg | INTRAMUSCULAR | Status: DC | PRN

## 2016-02-02 MED ORDER — MORPHINE SULFATE (PF) 0.5 MG/ML IJ SOLN
INTRAMUSCULAR | Status: DC | PRN
  Administered 2016-02-02: 3 mg via EPIDURAL

## 2016-02-02 MED ORDER — SODIUM CHLORIDE 0.9% FLUSH: 3.0000 mL | INTRAVENOUS | Status: DC | PRN

## 2016-02-02 MED ORDER — NALBUPHINE HCL 10 MG/ML IJ SOLN: 5.0000 mg | Freq: Once | INTRAMUSCULAR | Status: DC | PRN

## 2016-02-02 MED ORDER — CEFAZOLIN SODIUM-DEXTROSE 2-3 GM-% IV SOLR
INTRAVENOUS | Status: DC | PRN
  Administered 2016-02-02: 2 g via INTRAVENOUS

## 2016-02-02 MED ORDER — SODIUM BICARBONATE 8.4 % IV SOLN
INTRAVENOUS | Status: AC
  Filled 2016-02-02: qty 50

## 2016-02-02 MED ORDER — DEXAMETHASONE SODIUM PHOSPHATE 10 MG/ML IJ SOLN
INTRAMUSCULAR | Status: AC
  Filled 2016-02-02: qty 1

## 2016-02-02 MED ORDER — MEPERIDINE HCL 25 MG/ML IJ SOLN
INTRAMUSCULAR | Status: AC
  Filled 2016-02-02: qty 1

## 2016-02-02 MED ORDER — MORPHINE SULFATE (PF) 0.5 MG/ML IJ SOLN
INTRAMUSCULAR | Status: AC
  Filled 2016-02-02: qty 10

## 2016-02-02 MED ORDER — LACTATED RINGERS IV SOLN
INTRAVENOUS | Status: DC | PRN
  Administered 2016-02-02: 13:00:00 via INTRAVENOUS

## 2016-02-02 MED ORDER — MISOPROSTOL 25 MCG QUARTER TABLET
ORAL_TABLET | ORAL | Status: DC | PRN
Start: 2016-02-02 — End: 2016-02-02
  Administered 2016-02-02: 800 ug via RECTAL

## 2016-02-02 MED ORDER — ONDANSETRON HCL 4 MG/2ML IJ SOLN
INTRAMUSCULAR | Status: AC
  Filled 2016-02-02: qty 2

## 2016-02-02 MED ORDER — CEFAZOLIN SODIUM-DEXTROSE 2-3 GM-% IV SOLR
2.0000 g | Freq: Four times a day (QID) | INTRAVENOUS | Status: AC
  Administered 2016-02-02 – 2016-02-03 (×3): 2 g via INTRAVENOUS
  Filled 2016-02-02 (×3): qty 50

## 2016-02-02 MED ORDER — ACETAMINOPHEN 325 MG PO TABS
650.0000 mg | ORAL_TABLET | ORAL | Status: DC | PRN
  Administered 2016-02-03: 650 mg via ORAL
  Filled 2016-02-02: qty 2

## 2016-02-02 MED ORDER — CITRIC ACID-SODIUM CITRATE 334-500 MG/5ML PO SOLN
30.0000 mL | ORAL | Status: DC | PRN
  Filled 2016-02-02: qty 15

## 2016-02-02 MED ORDER — DIPHENHYDRAMINE HCL 25 MG PO CAPS
25.0000 mg | ORAL_CAPSULE | ORAL | Status: DC | PRN
  Filled 2016-02-02: qty 1

## 2016-02-02 MED ORDER — FENTANYL 2.5 MCG/ML BUPIVACAINE 1/10 % EPIDURAL INFUSION (WH - ANES)
INTRAMUSCULAR | Status: DC | PRN
  Administered 2016-02-02: 14 mL/h via EPIDURAL

## 2016-02-02 MED ORDER — OXYCODONE-ACETAMINOPHEN 5-325 MG PO TABS: 2.0000 | ORAL_TABLET | ORAL | Status: DC | PRN

## 2016-02-02 MED ORDER — ESMOLOL HCL 100 MG/10ML IV SOLN
INTRAVENOUS | Status: AC
  Filled 2016-02-02: qty 10

## 2016-02-02 MED ORDER — PHENYLEPHRINE 40 MCG/ML (10ML) SYRINGE FOR IV PUSH (FOR BLOOD PRESSURE SUPPORT)
80.0000 ug | PREFILLED_SYRINGE | INTRAVENOUS | Status: DC | PRN
  Administered 2016-02-02: 80 ug via INTRAVENOUS

## 2016-02-02 MED ORDER — ONDANSETRON HCL 4 MG/2ML IJ SOLN
INTRAMUSCULAR | Status: DC | PRN
  Administered 2016-02-02: 4 mg via INTRAVENOUS

## 2016-02-02 MED ORDER — SCOPOLAMINE 1 MG/3DAYS TD PT72SCOPOLAMINE 1 MG/3DAYS
MEDICATED_PATCH | TRANSDERMAL | Status: DC | PRN
Start: 2016-02-02 — End: 2016-02-02
  Administered 2016-02-02: 1 via TRANSDERMAL

## 2016-02-02 MED ORDER — OXYTOCIN 10 UNIT/ML IJ SOLN
2.5000 [IU]/h | INTRAVENOUS | Status: DC
  Administered 2016-02-03: 2.5 [IU]/h via INTRAVENOUS
  Filled 2016-02-02 (×2): qty 10

## 2016-02-02 MED ORDER — DIBUCAINE 1 % RE OINT: 1.0000 "application " | TOPICAL_OINTMENT | RECTAL | Status: DC | PRN

## 2016-02-02 SURGICAL SUPPLY — 40 items
APL SKNCLS STERI-STRIP NONHPOA (GAUZE/BANDAGES/DRESSINGS)
BENZOIN TINCTURE PRP APPL 2/3 (GAUZE/BANDAGES/DRESSINGS) IMPLANT
CLAMP CORD UMBIL (MISCELLANEOUS) IMPLANT
CLOSURE STERI STRIP 1/2 X4 (GAUZE/BANDAGES/DRESSINGS) ×1 IMPLANT
CLOTH BEACON ORANGE TIMEOUT ST (SAFETY) ×2 IMPLANT
DRAPE C SECTION CLR SCREEN (DRAPES) ×2 IMPLANT
DRSG OPSITE POSTOP 4X10 (GAUZE/BANDAGES/DRESSINGS) ×2 IMPLANT
DURAPREP 26ML APPLICATOR (WOUND CARE) ×2 IMPLANT
ELECT REM PT RETURN 9FT ADLT (ELECTROSURGICAL) ×2
ELECTRODE REM PT RTRN 9FT ADLT (ELECTROSURGICAL) ×1 IMPLANT
EXTRACTOR VACUUM KIWI (MISCELLANEOUS) IMPLANT
GLOVE BIO SURGEON STRL SZ 6.5 (GLOVE) ×2 IMPLANT
GLOVE BIOGEL PI IND STRL 7.0 (GLOVE) ×1 IMPLANT
GLOVE BIOGEL PI INDICATOR 7.0 (GLOVE) ×1
GOWN STRL REUS W/TWL LRG LVL3 (GOWN DISPOSABLE) ×4 IMPLANT
KIT ABG SYR 3ML LUER SLIP (SYRINGE) IMPLANT
NEEDLE HYPO 25X5/8 SAFETYGLIDE (NEEDLE) IMPLANT
NS IRRIG 1000ML POUR BTL (IV SOLUTION) ×2 IMPLANT
PACK C SECTION WH (CUSTOM PROCEDURE TRAY) ×2 IMPLANT
PAD ABD 7.5X8 STRL (GAUZE/BANDAGES/DRESSINGS) ×2 IMPLANT
PAD OB MATERNITY 4.3X12.25 (PERSONAL CARE ITEMS) ×2 IMPLANT
PENCIL SMOKE EVAC W/HOLSTER (ELECTROSURGICAL) ×2 IMPLANT
RTRCTR C-SECT PINK 25CM LRG (MISCELLANEOUS) IMPLANT
SPONGE GAUZE 4X4 12PLY STER LF (GAUZE/BANDAGES/DRESSINGS) ×1 IMPLANT
STRIP CLOSURE SKIN 1/2X4 (GAUZE/BANDAGES/DRESSINGS) IMPLANT
SUT CHROMIC 1 CTX 36 (SUTURE) ×4 IMPLANT
SUT PLAIN 0 NONE (SUTURE) IMPLANT
SUT PLAIN 2 0 XLH (SUTURE) ×2 IMPLANT
SUT VIC AB 0 CT1 27 (SUTURE) ×8
SUT VIC AB 0 CT1 27XBRD ANBCTR (SUTURE) ×2 IMPLANT
SUT VIC AB 0 CT1 36 (SUTURE) ×2 IMPLANT
SUT VIC AB 2-0 CT1 27 (SUTURE) ×2
SUT VIC AB 2-0 CT1 TAPERPNT 27 (SUTURE) ×1 IMPLANT
SUT VIC AB 3-0 CT1 27 (SUTURE)
SUT VIC AB 3-0 CT1 TAPERPNT 27 (SUTURE) IMPLANT
SUT VIC AB 3-0 SH 27 (SUTURE) ×2
SUT VIC AB 3-0 SH 27X BRD (SUTURE) ×1 IMPLANT
SUT VIC AB 4-0 KS 27 (SUTURE) ×2 IMPLANT
TOWEL OR 17X24 6PK STRL BLUE (TOWEL DISPOSABLE) ×3 IMPLANT
TRAY FOLEY CATH SILVER 14FR (SET/KITS/TRAYS/PACK) ×2 IMPLANT

## 2016-02-02 NOTE — Op Note (Signed)
Procedure note  Patient was taken to the operating room where epidural anesthesia was found to be adequate by Allis clamp test. She was splashed with betadine and draped in a sterile fashion in the dorsal supine position with a leftward tilt. An appropriate time out was performed. A Pfannenstiel skin incision was then made with the scalpel and carried through to the underlying layer of fascia by sharp dissection. The fascia was nicked in the midline and the incision was extended laterally with Mayo scissors. The superior and inferior aspects of the fascia were separated superiorly and inferiorly and the rectus muscles were also separated in the midline and the peritoneal cavity entered bluntly. The Alexis self-retaining wound retractor was then placed within the incision and the lower uterine segment exposed.n The lower uterine segment was then incised in a transverse fashion and the cavity itself entered bluntly.  An anterior placenta was encountered. The infant's head was then lifted and delivered from the incision without difficulty. The remainder of the infant delivered and the cord clamped and cut. The infant was handed off to the waiting pediatricians. The placenta was then spontaneously expressed from the uterus. The second sac was noted. Baby B was palpated to be tranverse lie. An attempt was made to rotate to vertex but when this was unsuccessful baby was delivered in breech position. Both feet were located and grasped and infant delivered to level of scapula. The arms were delivered across the chest after rotating 180 degrees. Infants head was then delivered next using smelly vite technique. Cord was clamped and cut and infant handed off to nursing staff as well. Placenta ws then delivered. The uterine incision was then repaired in 2 layers the first layer was a running locked layer 1-0 vicryl and the second an imbricating layer of the same suture. A bleeding vessel about 2cm from the left end of incision  was suture ligated several time with eventual bleeding controlled. The tubes and ovaries were inspected and noted to be normal and  the gutters cleared of all clots and debris. The uterine incision was inspected and found to be hemostatic. All instruments and sponges as well as the Alexis retractor were then removed from the abdomen. The peritoneum was then reapproximated  In a pursestring fashion and the rectus muscles in a large figure 8 stitch.The fascia was then closed with 0 Vicryl in a running fashion. Subcutaneous tissue was reapproximated with 3-0 plain in a running fashion. The skin was closed with a subcuticular stitch of 4-0 Vicryl on a Keith needle and then reinforced with benzoin and Steri-Strips and a pressure dressing.  At the conclusion of the procedure all instruments and sponge counts were correct. Patient was taken to the recovery room in guarded but stable condition, Babies were taken to nursery

## 2016-02-02 NOTE — Brief Op Note (Signed)
02/02/2016  1:53 PM  PATIENT:  Alyssa Hubbard  25 y.o. female  PRE-OPERATIVE DIAGNOSIS:  acute bleeding  twin gestation  POST-OPERATIVE DIAGNOSIS:  HELLP  PROCEDURE:  Procedure(s): CESAREAN SECTION (N/A)  SURGEON:  Surgeon(s) and Role:    * Cecilia Dionisio David, DO - Primary    * Cheri Fowler, MD - [place], MD - Assisting  PHYSICIAN ASSISTANT:   ASSISTANTS: Dr Meisinger and Dr Melba Coon   ANESTHESIA:   epidural  EBL:  Total I/O In: 4840 [I.V.:3500; Blood:1340] Out: 1560 [Urine:260; Blood:1300]  BLOOD ADMINISTERED:4 CC PRBC  DRAINS: none   LOCAL MEDICATIONS USED:  NONE  SPECIMEN:  Source of Specimen:  uterus/placenta  DISPOSITION OF SPECIMEN:  PATHOLOGY  COUNTS:  YES  TOURNIQUET:  * No tourniquets in log *  DICTATION: .Note written in EPIC  PLAN OF CARE: Admit to inpatient   PATIENT DISPOSITION:  PACU - guarded condition.   Delay start of Pharmacological VTE agent (>24hrs) due to surgical blood loss or risk of bleeding: not applicable

## 2016-02-02 NOTE — Consult Note (Signed)
Neonatology Note:   Attendance at C-section:    I was asked by Dr. Terri Piedra to attend this Stat C/S at 4 . The mother is a G3P2 A pos, GBS positive with di-di concordant twin gestation and otherwise uncomplicated pregnancy. ROM 4.5 hours prior to delivery, fluid bloody. Mother got Ampicillin almost 4 hours prior to delivery and was afebrile. Stat C/S was due to onset of copious vaginal bleeding; FHR monitoring was normal prior to C/S.  Twin A, a girl, was delivered vertex. She was blue, floppy, apneic, and had a HR < 100 at birth. I bulb suctioning large amounts of very thick, white mucous from her throat and nares. She remained apneic, so PPV was applied; she had some reflexive gasps, and her HR increased. PPV was continued for about 2.5 minutes, stopping once to suction again. By 3 minutes, she began to breathe regularly and soft stimulation was given. We placed a pulse oximeter and her O2 saturation was in the low 70s at 5 minutes, so BBO2 was given, with improvement in color and sats up to the 90s. The supplemental O2 was withdrawn and she maintained normal O2 saturations. Lungs were clear to auscultation with good air entry. Ap 1/7/8. Tone almost normal by 10 minutes. Infant without resp distress, had good perfusion.To CN to care of Pediatrician.  Twin B, a boy, delivered breech. He was dusky at birth, but with some muscle tone. We bulb suctioned a moderate amount of bloody mucous from his mouth and nares, then gave stimulation, and he began to cry right away. Ap 8/9. Lungs clear to auscultation, good tone and perfusion. To CN to care of Pediatrician.  I spoke with the father about both babies. The mother was very ill and I was not able to speak with her.   Real Cons, MD

## 2016-02-02 NOTE — MAU Note (Signed)
Pt reports having ctx q 5 min since 540am. Denies vag bleeing or SROM. Pt is pregnant with twins. Induction on Monday.

## 2016-02-02 NOTE — MAU Note (Signed)
Urine in lab 

## 2016-02-02 NOTE — Anesthesia Postprocedure Evaluation (Signed)
Anesthesia Post Note  Patient: Alenis Tryba  Procedure(s) Performed: Procedure(s) (LRB): CESAREAN SECTION (N/A)  Patient location during evaluation: PACU Anesthesia Type: Epidural Level of consciousness: awake, awake and alert, oriented and patient cooperative Pain management: pain level controlled Vital Signs Assessment: post-procedure vital signs reviewed and stable Respiratory status: spontaneous breathing, respiratory function stable and patient connected to nasal cannula oxygen Cardiovascular status: stable Postop Assessment: no headache Anesthetic complications: no Comments: FFP being given.  Received 4 units PRBC in OR.  PT 15, INR 1.27, active warming in process, will observe for at least 2 hours    Last Vitals:  Filed Vitals:   02/02/16 1416 02/02/16 1417  BP: 128/107   Pulse: 102 110  Temp:    Resp: 19 20    Last Pain:  Filed Vitals:   02/02/16 1423  PainSc: 0-No pain                 Alexis Frock

## 2016-02-02 NOTE — Progress Notes (Signed)
IV-saline locked right hand IV-saline locked RAC IV-infusing LAC

## 2016-02-02 NOTE — Anesthesia Preprocedure Evaluation (Signed)
Anesthesia Evaluation  Patient identified by MRN, date of birth, ID band Patient awake and Patient confused    Reviewed: Allergy & Precautions, H&P , NPO status , Patient's Chart, lab work & pertinent test results  Airway Mallampati: II       Dental   Pulmonary    Pulmonary exam normal breath sounds clear to auscultation       Cardiovascular Exercise Tolerance: Good hypertension, Normal cardiovascular exam Rhythm:regular Rate:Normal     Neuro/Psych    GI/Hepatic Patient received Oral Contrast Agents,  Endo/Other    Renal/GU      Musculoskeletal   Abdominal   Peds  Hematology   Anesthesia Other Findings   Reproductive/Obstetrics (+) Pregnancy TWINS                             Anesthesia Physical Anesthesia Plan  ASA: III  Anesthesia Plan: Epidural   Post-op Pain Management:    Induction:   Airway Management Planned:   Additional Equipment:   Intra-op Plan:   Post-operative Plan:   Informed Consent: I have reviewed the patients History and Physical, chart, labs and discussed the procedure including the risks, benefits and alternatives for the proposed anesthesia with the patient or authorized representative who has indicated his/her understanding and acceptance.     Plan Discussed with:   Anesthesia Plan Comments:         Anesthesia Quick Evaluation

## 2016-02-02 NOTE — Progress Notes (Signed)
Patient ID: Charlane Hoglen, female   DOB: 1991-06-13, 25 y.o.   MRN: [id number] Pt seen in PACU- pain controlled with meds Has no complaitns Still having mild vaginal oozing but fundus firm and 2cm lower than immediately post op and stable vitals  Plan: transfuse ffp now           Recheck coags and cbc in an hour           Close monitoring /pad counts            Routine pp./post op care

## 2016-02-02 NOTE — Anesthesia Procedure Notes (Signed)
Epidural Patient location during procedure: OB Start time: 02/02/2016 9:30 AM End time: 02/02/2016 9:45 AM  Staffing Anesthesiologist: Alexis Frock  Preanesthetic Checklist Completed: patient identified, site marked, surgical consent, pre-op evaluation, timeout performed, IV checked, risks and benefits discussed and monitors and equipment checked  Epidural Patient position: sitting Prep: site prepped and draped and DuraPrep Patient monitoring: heart rate, continuous pulse ox and blood pressure Approach: midline Location: L3-L4 Injection technique: LOR air  Needle:  Needle type: Tuohy  Needle gauge: 18 G Needle length: 9 cm and 9 Needle insertion depth: 7.5 cm Catheter type: closed end flexible Catheter size: 19 Gauge Catheter at skin depth: 14 cm Test dose: negative  Assessment Events: blood not aspirated, injection not painful, no injection resistance, negative IV test and no paresthesia  Additional Notes   Patient tolerated the insertion well without complications.Reason for block:procedure for pain

## 2016-02-02 NOTE — Transfer of Care (Addendum)
Immediate Anesthesia Transfer of Care Note  Patient: Alyssa Hubbard  Procedure(s) Performed: Procedure(s): CESAREAN SECTION (N/A)  Patient Location: PACU  Anesthesia Type:Epidural  Level of Consciousness: awake, alert , oriented and patient cooperative  Airway & Oxygen Therapy: Patient Spontanous Breathing  Post-op Assessment: Report given to RN and Post -op Vital signs reviewed and stable--Pt with SBP in 80s to low 100s, HR in 80s to 90s, mentating appropriately.  MDA called and made aware.  PACU RN preparing to administer blood products.  Post vital signs: Reviewed and stable  Last Vitals:  Filed Vitals:   02/02/16 1239 02/02/16 1240  BP:    Pulse: 169 169  Temp:    Resp:      Complications: No apparent anesthesia complications

## 2016-02-03 LAB — CBC
HEMATOCRIT: 24.2 % — AB (ref 36.0–46.0)
Hemoglobin: 7.9 g/dL — ABNORMAL LOW (ref 12.0–15.0)
MCH: 24.5 pg — ABNORMAL LOW (ref 26.0–34.0)
MCHC: 32.6 g/dL (ref 30.0–36.0)
MCV: 75.2 fL — AB (ref 78.0–100.0)
PLATELETS: 113 10*3/uL — AB (ref 150–400)
RBC: 3.22 MIL/uL — AB (ref 3.87–5.11)
RDW: 16.7 % — ABNORMAL HIGH (ref 11.5–15.5)
WBC: 10.4 10*3/uL (ref 4.0–10.5)

## 2016-02-03 LAB — CBC WITH DIFFERENTIAL/PLATELET
BASOS ABS: 0 10*3/uL (ref 0.0–0.1)
Basophils Relative: 0 %
Eosinophils Absolute: 0.1 10*3/uL (ref 0.0–0.7)
Eosinophils Relative: 1 %
HEMATOCRIT: 24.4 % — AB (ref 36.0–46.0)
HEMOGLOBIN: 8 g/dL — AB (ref 12.0–15.0)
LYMPHS PCT: 13 %
Lymphs Abs: 1.3 10*3/uL (ref 0.7–4.0)
MCH: 24.7 pg — ABNORMAL LOW (ref 26.0–34.0)
MCHC: 32.8 g/dL (ref 30.0–36.0)
MCV: 75.3 fL — AB (ref 78.0–100.0)
MONO ABS: 0.5 10*3/uL (ref 0.1–1.0)
MONOS PCT: 5 %
NEUTROS ABS: 8.1 10*3/uL — AB (ref 1.7–7.7)
NEUTROS PCT: 81 %
Platelets: 125 10*3/uL — ABNORMAL LOW (ref 150–400)
RBC: 3.24 MIL/uL — ABNORMAL LOW (ref 3.87–5.11)
RDW: 16.8 % — AB (ref 11.5–15.5)
WBC: 10 10*3/uL (ref 4.0–10.5)

## 2016-02-03 LAB — PREPARE FRESH FROZEN PLASMA
Unit division: 0
Unit division: 0

## 2016-02-03 LAB — PREPARE PLATELET PHERESIS: Unit division: 0

## 2016-02-03 NOTE — Lactation Note (Signed)
This note was copied from a baby's chart. Lactation Consultation Note  Patient Name: Alyssa Hubbard [id number] Date: 02/03/2016 Reason for consult: Follow-up assessment  With this mom of twins, now 21 hours old. I check on mom to see if the baby B was ready to feed again, since it was 3 hours since last feeding. He was showing cues, so I advised mom to try and breast feed, and then bottle feed EBM, and then formula . Lots of visitors in the room at this time, and babies being held by visitors. Mom knows to call for questions/concerns.    Maternal Data Formula Feeding for Exclusion: No (mom with PPH, baby fed formula whie in nursery, giving mom time to stabilize) Has patient been taught Hand Expression?: Yes Does the patient have breastfeeding experience prior to this delivery?: Yes  Feeding Feeding Type: Breast Fed Length of feed: 20 min  LATCH Score/Interventions Latch: Grasps breast easily, tongue down, lips flanged, rhythmical sucking.  Audible Swallowing: A few with stimulation  Type of Nipple: Everted at rest and after stimulation (easily expressed colostrum)  Comfort (Breast/Nipple): Soft / non-tender     Hold (Positioning): Assistance needed to correctly position infant at breast and maintain latch. Intervention(s): Breastfeeding basics reviewed;Support Pillows;Position options;Skin to skin  LATCH Score: 8  Lactation Tools Discussed/Used Pump Review: Setup, frequency, and cleaning;Milk Storage;Other (comment) (hand expression) Initiated by:: bedside RN Date initiated:: 02/03/16   Consult Status Consult Status: Follow-up Date: 02/04/16 Follow-up type: In-patient    Tonna Corner 02/03/2016, 12:52 PM

## 2016-02-03 NOTE — Lactation Note (Addendum)
This note was copied from a baby's chart. Lactation Consultation Note  Patient Name: Alyssa Hubbard [id number] Date: 02/03/2016 Reason for consult: Initial assessment   With this mom of 37 4/7 weeks twins, this baby A the girl, smaller, weighing 5 lbs 14.9 oz ( she gained 0.4 oz since birth) Mom s/p PPH at birth of the babies.  I assisted mom with latching Chloe to her right breast, which has the smaller nipple. She latched after multiple tries to fit the nipple in her mouth, but then stayed latched with good suckles for 15 minutes. Mom has easily expressed colostrum, and pumped 4 ml's this morning. After breast feeding, Chloe fed 2 ml's of colostrum from a bottle, and then 20 ml's of formula, and tolerated well. Mom encouraged to pump after each feeding, every 3 hours, when she feels up to it. Lactation services and some basic breastfeeding teaching reviewed. I told mom I would check on her around 12 noon, when babies a next due to feed.   Maternal Data Formula Feeding for Exclusion: No (mom PPH, baby twins and early term, required formula until mom well enough to brest feed) Has patient been taught Hand Expression?: Yes Does the patient have breastfeeding experience prior to this delivery?: Yes  Feeding Feeding Type: Breast Fed Length of feed:  (on breast for 15 minutes at this time, feeding on and off)  LATCH Score/Interventions Latch: Repeated attempts needed to sustain latch, nipple held in mouth throughout feeding, stimulation needed to elicit sucking reflex. (mom right breast nipple too large for baby A, but seh was able to latch to smaller nipple on left) Intervention(s): Adjust position;Assist with latch;Breast compression;Breast massage  Audible Swallowing: A few with stimulation (easily expressed colostrum) Intervention(s): Skin to skin;Hand expression  Type of Nipple: Everted at rest and after stimulation  Comfort (Breast/Nipple): Soft / non-tender     Hold (Positioning):  Assistance needed to correctly position infant at breast and maintain latch. Intervention(s): Breastfeeding basics reviewed;Support Pillows;Position options;Skin to skin  LATCH Score: 7  Lactation Tools Discussed/Used     Consult Status Consult Status: Follow-up Date: 02/04/16 Follow-up type: In-patient    Tonna Corner 02/03/2016, 9:44 AM

## 2016-02-03 NOTE — Anesthesia Postprocedure Evaluation (Signed)
Anesthesia Post Note  Patient: Alyssa Hubbard  Procedure(s) Performed: Procedure(s) (LRB): CESAREAN SECTION (N/A)  Patient location during evaluation: Antenatal Anesthesia Type: Epidural Level of consciousness: awake and alert Pain management: pain level controlled Vital Signs Assessment: post-procedure vital signs reviewed and stable Respiratory status: spontaneous breathing Cardiovascular status: stable Postop Assessment: no backache, no headache, epidural receding and patient able to bend at knees Anesthetic complications: no    Last Vitals:  Filed Vitals:   02/03/16 0840 02/03/16 1217  BP: 124/76 128/61  Pulse: 75 72  Temp:  36.6 C  Resp:  16    Last Pain:  Filed Vitals:   02/03/16 1218  PainSc: 1                  Everette Rank

## 2016-02-03 NOTE — Addendum Note (Signed)
Addendum  created 02/03/16 1352 by Georgeanne Nim, CRNA   Modules edited: Clinical Notes   Clinical Notes:  File: [id number]

## 2016-02-03 NOTE — H&P (Signed)
Alyssa Hubbard is a 25 y.o. female presenting for persistent contractions. Pt had been 5cm in office a week ago and was 7cm when arrived at [facility]. She spontaneously ruptured en route to L/D- clear fluid. Pt rating pain at 4/10 at this time. Her prenatal course was relatively benign- elarly noted previa did resolve per scan in 10/2015 and IF noted on twin A but anatomy normal otherwise. Dating was based on LMP and confirmed by first triemster [place]. Di-Di twin pregnancy  Maternal Medical History:  Reason for admission: Contractions.  Nausea.  Contractions: Onset was yesterday.   Frequency: regular.   Perceived severity is moderate.    Fetal activity: Perceived fetal activity is normal.   Last perceived fetal movement was within the past hour.    Prenatal complications: no prenatal complications Prenatal Complications - Diabetes: none.    OB History    Gravida Para Term Preterm AB TAB SAB Ectopic Multiple Living   3 3 3  0 0 0 0 0 1 4     Past Medical History  Diagnosis Date  . Gall stones   . Anemia     states "supposed to be on Iron, but has not been taking it".  . Pregnancy induced hypertension     with first preg  . Fibroid     "I have had them for years".    Past Surgical History  Procedure Laterality Date  . No past surgeries     Family History: family history includes Asthma in her sister; Diabetes in her sister. There is no history of Arthritis, Alcohol abuse, Birth defects, Cancer, COPD, Depression, Drug abuse, Early death, Hearing loss, Heart disease, Hyperlipidemia, Hypertension, Kidney disease, Learning disabilities, Mental illness, Mental retardation, Miscarriages / Stillbirths, Stroke, Vision loss, or Varicose Veins. Social History:  reports that she has never smoked. She has never used smokeless tobacco. She reports that she does not drink alcohol or use illicit drugs.   Prenatal Transfer Tool  Maternal Diabetes: No Genetic Screening: Declined Maternal  Ultrasounds/Referrals: Normal Fetal Ultrasounds or other Referrals:  None Maternal Substance Abuse:  No Significant Maternal Medications:  None Significant Maternal Lab Results:  Lab values include: Group B Strep positive Other Comments:  None  Review of Systems  Constitutional: Negative for fever, chills, weight loss and malaise/fatigue.  Eyes: Negative for blurred vision.  Respiratory: Negative for shortness of breath.   Cardiovascular: Negative for chest pain.  Gastrointestinal: Positive for abdominal pain. Negative for heartburn, nausea and vomiting.  Musculoskeletal: Negative for back pain.  Neurological: Negative for dizziness and headaches.  Psychiatric/Behavioral: Negative for depression. The patient is not nervous/anxious.     Dilation: 8.5 Effacement (%): 100 Station: 0 Exam by:: DR. Terri Piedra Blood pressure 133/76, pulse 99, temperature 98.7 F (37.1 C), temperature source Oral, resp. rate 18, height 5\' 8"  (1.727 m), weight 265 lb (120.203 kg), last menstrual period 05/15/2015, SpO2 98 %, unknown if currently breastfeeding. Maternal Exam:  Uterine Assessment: Contraction strength is moderate.  Contraction frequency is regular.   Abdomen: Patient reports generalized tenderness.  Introitus: Normal vulva. Normal vagina.  Nitrazine test: positive. Amniotic fluid character: clear.  Pelvis: adequate for delivery.   Cervix: Cervix evaluated by digital exam.     Physical Exam  Constitutional: She is oriented to person, place, and time. She appears well-developed.  HENT:  Head: Normocephalic.  Neck: Normal range of motion.  Cardiovascular: Normal rate.   Respiratory: Effort normal.  GI: Soft. There is generalized tenderness. There is no guarding.  Musculoskeletal: Normal range of motion. She exhibits edema. She exhibits no tenderness.  Neurological: She is alert and oriented to person, place, and time.  Skin: Skin is warm.  Psychiatric: She has a normal mood and affect.  Her behavior is normal. Judgment and thought content normal.    Prenatal labs: ABO, Rh: --/--/A POS (03/09 [id number]) Antibody: NEG (03/09 0823) Rubella: Immune (08/24 0000) RPR: Non Reactive (03/09 0823)  HBsAg: Negative (08/24 0000)  HIV: Non-reactive (08/24 0000)  GBS: Positive (02/24 0000)   Assessment/Plan: [id number] at 78 5/[redacted]weeks gestation with di-di twin pregnancy in active labor, SROM - clear Pt may have epidural for pain control Recheck in1-2hours GBS prophylaxis to start ASAP Bedside u/s notes baby A vertex and baby B oblique with fetal head in maternal mid right side; pt counselled as to possibility for internal version after delivery of baby A vs primary c/s if does not orient to vertex All question answered   [place] 02/03/2016, 2:04 AM    3

## 2016-02-03 NOTE — Lactation Note (Signed)
This note was copied from a baby's chart. Lactation Consultation Note  Patient Name: Alyssa Hubbard [id number] Date: 02/03/2016 Reason for consult: Follow-up assessment    With this mom of twins, now 34 hours old. Mom said Baby A woke up earlier, but would not breast feed. I advised mom to try bottle feeding if baby will no breast feed, and thaat since she is under 6 pounds, she should be fed at least every 3 hours. I also reviewed initiation setting on DEP. Mom did pump again, and again expressed 2-3 ml's of colostrum. Mom knows to call for questions/concerns.   Maternal Data Formula Feeding for Exclusion: No (mom PPH, baby twins and early term, required formula until mom well enough to brest feed) Has patient been taught Hand Expression?: Yes Does the patient have breastfeeding experience prior to this delivery?: Yes  Feeding Feeding Type: Breast Fed Length of feed:  (on breast for 15 minutes at this time, feeding on and off)  LATCH Score/Interventions Latch: Repeated attempts needed to sustain latch, nipple held in mouth throughout feeding, stimulation needed to elicit sucking reflex. (mom right breast nipple too large for baby A, but seh was able to latch to smaller nipple on left) Intervention(s): Adjust position;Assist with latch;Breast compression;Breast massage  Audible Swallowing: A few with stimulation (easily expressed colostrum) Intervention(s): Skin to skin;Hand expression  Type of Nipple: Everted at rest and after stimulation  Comfort (Breast/Nipple): Soft / non-tender     Hold (Positioning): Assistance needed to correctly position infant at breast and maintain latch. Intervention(s): Breastfeeding basics reviewed;Support Pillows;Position options;Skin to skin  LATCH Score: 7  Lactation Tools Discussed/Used     Consult Status Consult Status: Follow-up Date: 02/04/16 Follow-up type: In-patient    Tonna Corner 02/03/2016, 12:48 PM

## 2016-02-03 NOTE — Progress Notes (Signed)
Subjective: Postpartum Day 1: Cesarean Delivery, likely abruption Hemorrhage and 4 units pRBCS also plasma. Stable this AM Patient reports incisional pain and tolerating PO.    Objective: Vital signs in last 24 hours: Temp:  [97.6 F (36.4 C)-98.8 F (37.1 C)] 98.4 F (36.9 C) (03/10 0358) Pulse Rate:  [32-181] 100 (03/10 0605) Resp:  [16-27] 18 (03/10 0358) BP: (82-148)/(33-107) 116/77 mmHg (03/10 0605) SpO2:  [77 %-100 %] 100 % (03/10 [id number])  Physical Exam:  General: alert and no distress Lochia: appropriate Uterine Fundus: firm Incision: healing well DVT Evaluation: No evidence of DVT seen on physical exam.   Recent Labs  02/02/16 1824 02/03/16 0551  HGB 9.3* 7.9*  HCT 28.5* 24.2*    Assessment/Plan: Status post Cesarean section. Doing well postoperatively.  Continue current care.  D/C foley, encourage ambulation  Alyssa Hubbard, Alyssa Hubbard 02/03/2016, 8:03 AM

## 2016-02-03 NOTE — Lactation Note (Signed)
This note was copied from a baby's chart. Lactation Consultation Note  Patient Name: Alyssa Hubbard [id number] Date: 02/03/2016 Reason for consult: Initial assessment    With this mom of 87 4/7 weeks twins, born yesterday by c-section, and mom s/p PPH. Mom doing better now, 20 hour post partum, and i assisted her with latching both babies to her breast at the same time. Baby boy B latched well, wide mouth, able to fit mom's large nipple in his mouth, and maintained active sucking for about 10 minutes of the 15 he ws at the breast. After the feeding, he fed 2 ml's of colostrum by bottle, and then 5 ml's of formula by bottle.  Dad very involved and helpful. Mom knows to pump every 3 hours, after breastfeeidng, when she is strong enough to do so. I gave mom her 27 flanges to use, since the 24's were too small. I told her to let me know if the 27's were also too small. I reviewed lactation services and some basic breast feeding teaching. Mom knows to call for questions/concerns.    Maternal Data Formula Feeding for Exclusion: No (mom with PPH, baby fed formula whie in nursery, giving mom time to stabilize) Has patient been taught Hand Expression?: Yes Does the patient have breastfeeding experience prior to this delivery?: Yes  Feeding Feeding Type: Breast Fed Nipple Type: Slow - flow Length of feed: 20 min  LATCH Score/Interventions Latch: Grasps breast easily, tongue down, lips flanged, rhythmical sucking.  Audible Swallowing: A few with stimulation  Type of Nipple: Everted at rest and after stimulation (easily expressed colostrum)  Comfort (Breast/Nipple): Soft / non-tender     Hold (Positioning): Assistance needed to correctly position infant at breast and maintain latch. Intervention(s): Breastfeeding basics reviewed;Support Pillows;Position options;Skin to skin  LATCH Score: 8  Lactation Tools Discussed/Used Pump Review: Setup, frequency, and cleaning;Milk Storage;Other  (comment) (hand expression) Initiated by:: bedside RN Date initiated:: 02/03/16   Consult Status Consult Status: Follow-up Date: 02/04/16 Follow-up type: In-patient    Tonna Corner 02/03/2016, 10:46 AM

## 2016-02-04 ENCOUNTER — Encounter (HOSPITAL_COMMUNITY): Payer: Self-pay | Admitting: Obstetrics and Gynecology

## 2016-02-04 MED ORDER — OXYCODONE HCL 5 MG PO TABS
5.0000 mg | ORAL_TABLET | ORAL | Status: DC | PRN
  Administered 2016-02-04 (×3): 5 mg via ORAL
  Filled 2016-02-04 (×4): qty 1

## 2016-02-04 NOTE — Progress Notes (Signed)
POD #2 Doing ok, ambulating, bleeding ok Afeb, VSS Abd- soft, fundus firm, incision intact Hgb stable yesterday at 8.0 Continue routine care

## 2016-02-05 LAB — TYPE AND SCREEN
ABO/RH(D): A POS
ANTIBODY SCREEN: NEGATIVE
UNIT DIVISION: 0
UNIT DIVISION: 0
UNIT DIVISION: 0
UNIT DIVISION: 0
Unit division: 0
Unit division: 0

## 2016-02-05 MED ORDER — IBUPROFEN 600 MG PO TABS: 600.0000 mg | ORAL_TABLET | Freq: Four times a day (QID) | ORAL | Status: DC

## 2016-02-05 MED ORDER — OXYCODONE HCL 5 MG PO TABS: 5.0000 mg | ORAL_TABLET | ORAL | Status: DC | PRN

## 2016-02-05 NOTE — Discharge Instructions (Signed)
As per discharge pamphlet °

## 2016-02-05 NOTE — Lactation Note (Signed)
This note was copied from a baby's chart. Lactation Consultation Note  Mom reports that her girl (baby A) is starting to latch better. Baby B has been latching consistently and receiving formula after BF. She is not pumping consistently. Her goal is not to have the babies at the breast but to have a large supply of milk in the freezer. Supply and demand reviewed. Explained to her the need to pump 8-10 times in 24 hours to establish her milk supply. She has a lansinoh pump at home and plans to get [facility].  Suggested that she try using her pump for a couple of days but if her milk does not come to volume by day 5 she needs to seriously consider a hospital grade pump. Understanding verbalized. Patient Name: Alyssa Hubbard [id number] Date: 02/05/2016     Maternal Data    Feeding    LATCH Score/Interventions                      Lactation Tools Discussed/Used     Consult Status      Van Clines 02/05/2016, 9:43 AM

## 2016-02-05 NOTE — Progress Notes (Signed)
POD #3 Doing well Afeb, VSS Abd- soft, fundus firm, incision intact D/c home

## 2016-02-05 NOTE — Discharge Summary (Signed)
OB Discharge Summary     Patient Name: Alyssa Hubbard DOB: September 22, 1991 MRN: [id number]  Date of admission: 02/02/2016 Delivering MD:    Margeret, Sliva [facility] [id number]  [place], [place], [place] [id number]  Carlynn Purl [facility]   Date of discharge: 02/05/2016  Admitting diagnosis: 79WKS,CTX Intrauterine pregnancy: [redacted]w[redacted]d     Secondary diagnosis:  Active Problems:   Active labor   Twin pregnancy, delivered by cesarean section, current hospitalization   Postpartum care following cesarean delivery  Additional problems: Intrapartum hemorrhage     Discharge diagnosis: Di/di twin pregnancy delivered at 37 weeks, intrapartum hemorrhage with possible abruption                                                                                                Complications: Placental Abruption  Hospital course:  Onset of Labor With Unplanned C/S  25 y.o. yo [id number] at 108w4d was admitted in [place] on 02/02/2016. Patient had a labor course significant for intrapartum hemorrhage requiring activation of MTP with transfusion and stat c-section. Membrane Rupture Time/Date:    Adrielle, [place] [id number]  8:36 AM   Nicle, Milkowski [place] [id number]  8:36 AM ,   Riquel, [place] [id number]  02/02/2016   Jearlene, Smuck [id number]  02/02/2016   The patient went for cesarean section due to intrapartum hemorrhage./suspected abruption, and delivered a Viable infant,   Priscillia, Langowski [id number]  02/02/2016   Morganne, Brazzel [id number]  02/02/2016  Details of operation can be found in separate operative note. Patient had an uncomplicated postpartum course.  She is ambulating,tolerating a regular diet, passing flatus, and urinating well.  Patient is discharged home in stable condition 02/05/2016.  Physical exam  Filed Vitals:   02/03/16 1743 02/04/16 0640 02/04/16 1800 02/05/16 0608  BP: 98/80 117/63 141/70 132/69  Pulse: 77 75  87 75  Temp: 98.7 F (37.1 C) 97.2 F (36.2 C) 98.9 F (37.2 C) 98.5 F (36.9 C)  TempSrc: Oral   Oral  Resp:  18 19 18   Height:      Weight:      SpO2: 100%  100% 100%   General: alert Lochia: appropriate Uterine Fundus: firm Incision: Healing well with no significant drainage  Labs: Lab Results  Component Value Date   WBC 10.0 02/03/2016   HGB 8.0* 02/03/2016   HCT 24.4* 02/03/2016   MCV 75.3* 02/03/2016   PLT 125* 02/03/2016   CMP Latest Ref Rng 02/02/2016  Glucose 70 - 99 mg/dL -  BUN 6 - 23 mg/dL -  Creatinine 0.50 - 1.10 mg/dL -  Sodium 135 - 145 mmol/L 135  Potassium 3.5 - 5.1 mmol/L 4.6  Chloride 101 - 111 mmol/L 108  CO2 22 - 32 mmol/L 19(L)  Calcium 8.4 - 10.5 mg/dL -  Total Protein 6.0 - 8.3 g/dL -  Total Bilirubin 0.3 - 1.2 mg/dL -  Alkaline Phos 39 - 117 U/L -  AST 0 - 37 U/L -  ALT 0 - 35 U/L -    Discharge instruction: per After Visit Summary and "Baby  and Me Booklet".  After visit meds:    Medication List    TAKE these medications        ferrous sulfate 325 (65 FE) MG EC tablet  Take 1 tablet (325 mg total) by mouth 2 (two) times daily.     ibuprofen 600 MG tablet  Commonly known as:  ADVIL,MOTRIN  Take 1 tablet (600 mg total) by mouth every 6 (six) hours.     oxyCODONE 5 MG immediate release tablet  Commonly known as:  Oxy IR/ROXICODONE  Take 1 tablet (5 mg total) by mouth every 4 (four) hours as needed for severe pain.     prenatal multivitamin Tabs tablet  Take 1 tablet by mouth daily at 12 noon.        Diet: routine diet  Activity: Advance as tolerated. Pelvic rest for 6 weeks.   Outpatient follow up:2 weeks   Newborn Data:   Florabell, Violett [id number]  Live born female  Birth Weight: 5 lb 14.4 oz (2675 g) APGAR: 1, [place], [place] [id number]  Live born female  Birth Weight: 6 lb 11.6 oz (3050 g) APGAR: 7, 9  Baby Feeding: Bottle and Breast Disposition:home with  mother   02/05/2016 Clarene Duke, MD

## 2016-02-06 ENCOUNTER — Inpatient Hospital Stay (HOSPITAL_COMMUNITY): Admission: RE | Admit: 2016-02-06 | Payer: Medicaid Other | Source: Ambulatory Visit

## 2016-02-11 ENCOUNTER — Inpatient Hospital Stay (HOSPITAL_COMMUNITY): Payer: Medicaid Other

## 2016-02-11 ENCOUNTER — Encounter (HOSPITAL_COMMUNITY): Payer: Self-pay | Admitting: *Deleted

## 2016-02-11 ENCOUNTER — Inpatient Hospital Stay (HOSPITAL_COMMUNITY)
Admission: AD | Admit: 2016-02-11 | Discharge: 2016-02-11 | Disposition: A | Discharge status: Home / Self Care | Payer: Medicaid Other | Source: Ambulatory Visit | Attending: Obstetrics and Gynecology | Admitting: Obstetrics and Gynecology

## 2016-02-11 DIAGNOSIS — O9089 Other complications of the puerperium, not elsewhere classified: Secondary | ICD-10-CM | POA: Diagnosis not present

## 2016-02-11 DIAGNOSIS — R0602 Shortness of breath: Secondary | ICD-10-CM | POA: Diagnosis not present

## 2016-02-11 LAB — COMPREHENSIVE METABOLIC PANEL
ALT: 27 U/L (ref 14–54)
ANION GAP: 10 (ref 5–15)
AST: 22 U/L (ref 15–41)
Albumin: 3.2 g/dL — ABNORMAL LOW (ref 3.5–5.0)
Alkaline Phosphatase: 77 U/L (ref 38–126)
BUN: 11 mg/dL (ref 6–20)
CALCIUM: 8.8 mg/dL — AB (ref 8.9–10.3)
CHLORIDE: 105 mmol/L (ref 101–111)
CO2: 25 mmol/L (ref 22–32)
CREATININE: 0.65 mg/dL (ref 0.44–1.00)
Glucose, Bld: 108 mg/dL — ABNORMAL HIGH (ref 65–99)
Potassium: 4.3 mmol/L (ref 3.5–5.1)
SODIUM: 140 mmol/L (ref 135–145)
Total Bilirubin: 0.5 mg/dL (ref 0.3–1.2)
Total Protein: 6.6 g/dL (ref 6.5–8.1)

## 2016-02-11 LAB — CBC
HCT: 26.9 % — ABNORMAL LOW (ref 36.0–46.0)
Hemoglobin: 8.2 g/dL — ABNORMAL LOW (ref 12.0–15.0)
MCH: 24.1 pg — AB (ref 26.0–34.0)
MCHC: 30.5 g/dL (ref 30.0–36.0)
MCV: 79.1 fL (ref 78.0–100.0)
PLATELETS: 262 10*3/uL (ref 150–400)
RBC: 3.4 MIL/uL — AB (ref 3.87–5.11)
RDW: 17.8 % — ABNORMAL HIGH (ref 11.5–15.5)
WBC: 8.5 10*3/uL (ref 4.0–10.5)

## 2016-02-11 LAB — PROTEIN / CREATININE RATIO, URINE
CREATININE, URINE: 89 mg/dL
PROTEIN CREATININE RATIO: 0.35 mg/mg{creat} — AB (ref 0.00–0.15)
TOTAL PROTEIN, URINE: 31 mg/dL

## 2016-02-11 MED ORDER — FUROSEMIDE 40 MG PO TABS
40.0000 mg | ORAL_TABLET | Freq: Every day | ORAL | Status: DC
Start: 2016-02-11 — End: 2018-08-05

## 2016-02-11 MED ORDER — FUROSEMIDE 40 MG PO TABS
40.0000 mg | ORAL_TABLET | Freq: Once | ORAL | Status: AC
  Administered 2016-02-11: 40 mg via ORAL
  Filled 2016-02-11: qty 1

## 2016-02-11 MED ORDER — FUROSEMIDE 10 MG/ML IJ SOLN
40.0000 mg | Freq: Once | INTRAMUSCULAR | Status: DC
  Filled 2016-02-11: qty 4

## 2016-02-11 NOTE — MAU Provider Note (Signed)
History     CSN: [id number]  Arrival date and time: 02/11/16 1847   None     Chief Complaint  Patient presents with  . Shortness of Breath   HPI   [name] is a 25 y.o.female [id number] post partum, status post primary cesarean section on March 9th presenting to MAU with shortness of breath. Pt had an abruption during labor course and was given 4 units PRBC's. Shortness of breath since delivery; having a hard time laying flat in bed at night. She feels like she cannot catch her breath even when she is just sitting.   Denies HA now, however has had HA off and on throughout the week.   OB History    Gravida Para Term Preterm AB TAB SAB Ectopic Multiple Living   3 3 3  0 0 0 0 0 1 4      Past Medical History  Diagnosis Date  . Gall stones   . Anemia     states "supposed to be on Iron, but has not been taking it".  . Pregnancy induced hypertension     with first preg  . Fibroid     "I have had them for years".     Past Surgical History  Procedure Laterality Date  . No past surgeries    . Cesarean section N/A 02/02/2016    Procedure: CESAREAN SECTION;  Surgeon: Sherlyn Hay, DO;  Location: [facility];  Service: Obstetrics;  Laterality: N/A;    Family History  Problem Relation Age of Onset  . Arthritis Neg Hx   . Alcohol abuse Neg Hx   . Birth defects Neg Hx   . Cancer Neg Hx   . COPD Neg Hx   . Depression Neg Hx   . Drug abuse Neg Hx   . Early death Neg Hx   . Hearing loss Neg Hx   . Heart disease Neg Hx   . Hyperlipidemia Neg Hx   . Hypertension Neg Hx   . Kidney disease Neg Hx   . Learning disabilities Neg Hx   . Mental illness Neg Hx   . Mental retardation Neg Hx   . Miscarriages / Stillbirths Neg Hx   . Stroke Neg Hx   . Vision loss Neg Hx   . Varicose Veins Neg Hx   . Diabetes Sister   . Asthma Sister     Social History  Substance Use Topics  . Smoking status: Never Smoker   . Smokeless tobacco: Never Used  . Alcohol Use: No     Allergies: No Known Allergies  Prescriptions prior to admission  Medication Sig Dispense Refill Last Dose  . ferrous sulfate 325 (65 FE) MG EC tablet Take 1 tablet (325 mg total) by mouth 2 (two) times daily. (Patient not taking: Reported on 02/02/2016) 60 tablet 3 07/01/2015 at Unknown time  . ibuprofen (ADVIL,MOTRIN) 600 MG tablet Take 1 tablet (600 mg total) by mouth every 6 (six) hours. 30 tablet 0   . oxyCODONE (OXY IR/ROXICODONE) 5 MG immediate release tablet Take 1 tablet (5 mg total) by mouth every 4 (four) hours as needed for severe pain. 30 tablet 0   . Prenatal Vit-Fe Fumarate-FA (PRENATAL MULTIVITAMIN) TABS tablet Take 1 tablet by mouth daily at 12 noon. 30 tablet 10 02/01/2016 at Unknown time   Results for orders placed or performed during the hospital encounter of 02/11/16 (from the past 48 hour(s))  Protein / creatinine ratio, urine     Status: Abnormal  Collection Time: 02/11/16  6:55 PM  Result Value Ref Range   Creatinine, Urine 89.00 mg/dL   Total Protein, Urine 31 mg/dL    Comment: NO NORMAL RANGE ESTABLISHED FOR THIS TEST   Protein Creatinine Ratio 0.35 (H) 0.00 - 0.15 mg/mg[Cre]    Review of Systems  Eyes: Negative for blurred vision.  Gastrointestinal: Negative for abdominal pain.  Genitourinary: Negative for dysuria.  Neurological: Positive for headaches.   Physical Exam   Blood pressure 156/87, pulse 73, temperature 97.9 F (36.6 C), temperature source Oral, resp. rate 16, last menstrual period 05/15/2015, SpO2 100 %, unknown if currently breastfeeding.   Patient Vitals for the past 24 hrs:  BP Temp Temp src Pulse Resp SpO2  02/11/16 1959 - - - 68 - 100 %  02/11/16 1955 - - - 66 - 100 %  02/11/16 1950 - - - 63 - 100 %  02/11/16 1947 - - - 67 - 100 %  02/11/16 1946 161/100 mmHg - - 66 - -  02/11/16 1943 156/92 mmHg - - 80 - -  02/11/16 1942 - - - 85 - 100 %  02/11/16 1927 - - - 74 - 100 %  02/11/16 1922 - - - 73 - 100 %  02/11/16 1917 - - - 73 - 100 %   02/11/16 1905 156/87 mmHg 97.9 F (36.6 C) Oral 69 16 -  02/11/16 1904 - - - - - 100 %   Dg Chest 2 View  02/11/2016  CLINICAL DATA:  Acute onset of shortness of breath and upper back pain. Initial encounter. EXAM: CHEST  2 VIEW COMPARISON:  None. FINDINGS: The lungs are well-aerated and clear. There is no evidence of focal opacification, pleural effusion or pneumothorax. The heart is normal in size; the mediastinal contour is within normal limits. No acute osseous abnormalities are seen. IMPRESSION: No acute cardiopulmonary process seen. Electronically Signed   By: Garald Balding M.D.   On: 02/11/2016 19:42    Physical Exam  Nursing note and vitals reviewed. Constitutional: She is oriented to person, place, and time. She appears well-developed and well-nourished. No distress.  HENT:  Head: Normocephalic.  Neck: Neck supple.  Cardiovascular: Normal rate and normal heart sounds.   Respiratory: Effort normal and breath sounds normal. No respiratory distress. She has no wheezes. She has no rales.  GI: Soft. There is no tenderness.  Musculoskeletal: Normal range of motion. She exhibits no edema.  Neurological: She is alert and oriented to person, place, and time. She displays normal reflexes.  Negative clonus   Skin: Skin is warm. She is not diaphoretic.  Psychiatric: Her behavior is normal.   Results for orders placed or performed during the hospital encounter of 02/11/16 (from the past 24 hour(s))  Protein / creatinine ratio, urine     Status: Abnormal   Collection Time: 02/11/16  6:55 PM  Result Value Ref Range   Creatinine, Urine 89.00 mg/dL   Total Protein, Urine 31 mg/dL   Protein Creatinine Ratio 0.35 (H) 0.00 - 0.15 mg/mg[Cre]  CBC     Status: Abnormal   Collection Time: 02/11/16  7:53 PM  Result Value Ref Range   WBC 8.5 4.0 - 10.5 K/uL   RBC 3.40 (L) 3.87 - 5.11 MIL/uL   Hemoglobin 8.2 (L) 12.0 - 15.0 g/dL   HCT 26.9 (L) 36.0 - 46.0 %   MCV 79.1 78.0 - 100.0 fL   MCH  24.1 (L) 26.0 - 34.0 pg   MCHC 30.5 30.0 -  36.0 g/dL   RDW 17.8 (H) 11.5 - 15.5 %   Platelets 262 150 - 400 K/uL  Comprehensive metabolic panel     Status: Abnormal   Collection Time: 02/11/16  7:53 PM  Result Value Ref Range   Sodium 140 135 - 145 mmol/L   Potassium 4.3 3.5 - 5.1 mmol/L   Chloride 105 101 - 111 mmol/L   CO2 25 22 - 32 mmol/L   Glucose, Bld 108 (H) 65 - 99 mg/dL   BUN 11 6 - 20 mg/dL   Creatinine, Ser 0.65 0.44 - 1.00 mg/dL   Calcium 8.8 (L) 8.9 - 10.3 mg/dL   Total Protein 6.6 6.5 - 8.1 g/dL   Albumin 3.2 (L) 3.5 - 5.0 g/dL   AST 22 15 - 41 U/L   ALT 27 14 - 54 U/L   Alkaline Phosphatase 77 38 - 126 U/L   Total Bilirubin 0.5 0.3 - 1.2 mg/dL   GFR calc non Af Amer >60 >60 mL/min   GFR calc Af Amer >60 >60 mL/min   Anion gap 10 5 - 15   MAU Course  Procedures  None  MDM  CBC CMP Protein creatine ratio Chest xray Labs pending> discussed HPI and chest xray results with Dr. Marvel Plan. Will wait for labs to decide plan of care.  Patient has an appointment scheduled in the office on 3/28.  Report given to [place] who resumes care of the patient@ 2005 Dr Marvel Plan called and discussed labor course of pt and labs. Will give one dose of lasix and then pt will be discharged with RX for Lasix 40mg  PO q day. She is to call the office on Monday and schedule an appointment with Dr. Terri Piedra no later than Wednesday. She is to call the office if she feels there is no improvement with her SOB and be seen earlier.   Lezlie Lye, NP   Assessment and Plan   SOB Postpartum Rx: Lasix 40 mg PO q day Call office and schedule appointment  Discharge [place]

## 2016-02-11 NOTE — Discharge Instructions (Signed)
Shortness of Breath Shortness of breath means you have trouble breathing. It could also mean that you have a medical problem. You should get immediate medical care for shortness of breath. CAUSES   Not enough oxygen in the air such as with high altitudes or a smoke-filled room.  Certain lung diseases, infections, or problems.  Heart disease or conditions, such as angina or heart failure.  Low red blood cells (anemia).  Poor physical fitness, which can cause shortness of breath when you exercise.  Chest or back injuries or stiffness.  Being overweight.  Smoking.  Anxiety, which can make you feel like you are not getting enough air. DIAGNOSIS  Serious medical problems can often be found during your physical exam. Tests may also be done to determine why you are having shortness of breath. Tests may include:  Chest X-rays.  Lung function tests.  Blood tests.  An electrocardiogram (ECG).  An ambulatory electrocardiogram. An ambulatory ECG records your heartbeat patterns over a 24-hour period.  Exercise testing.  A transthoracic echocardiogram (TTE). During echocardiography, sound waves are used to evaluate how blood flows through your heart.  A transesophageal echocardiogram (TEE).  Imaging scans. Your health care provider may not be able to find a cause for your shortness of breath after your exam. In this case, it is important to have a follow-up exam with your health care provider as directed.  TREATMENT  Treatment for shortness of breath depends on the cause of your symptoms and can vary greatly. HOME CARE INSTRUCTIONS   Do not smoke. Smoking is a common cause of shortness of breath. If you smoke, ask for help to quit.  Avoid being around chemicals or things that may bother your breathing, such as paint fumes and dust.  Rest as needed. Slowly resume your usual activities.  If medicines were prescribed, take them as directed for the full length of time directed. This  includes oxygen and any inhaled medicines.  Keep all follow-up appointments as directed by your health care provider. SEEK MEDICAL CARE IF:   Your condition does not improve in the time expected.  You have a hard time doing your normal activities even with rest.  You have any new symptoms. SEEK IMMEDIATE MEDICAL CARE IF:   Your shortness of breath gets worse.  You feel light-headed, faint, or develop a cough not controlled with medicines.  You start coughing up blood.  You have pain with breathing.  You have chest pain or pain in your arms, shoulders, or abdomen.  You have a fever.  You are unable to walk up stairs or exercise the way you normally do. MAKE SURE YOU:  Understand these instructions.  Will watch your condition.  Will get help right away if you are not doing well or get worse.   This information is not intended to replace advice given to you by your health care provider. Make sure you discuss any questions you have with your health care provider.   Document Released: 08/07/2001 Document Revised: 11/17/2013 Document Reviewed: 01/28/2012 Elsevier Interactive Patient Education 2016 [employer]. Furosemide tablets What is this medicine? FUROSEMIDE (fyoor [place] se mide) is a diuretic. It helps you make more urine and to lose salt and excess water from your body. This medicine is used to treat high blood pressure, and edema or swelling from heart, kidney, or liver disease. This medicine may be used for other purposes; ask your health care provider or pharmacist if you have questions. What should I tell my  health care provider before I take this medicine? They need to know if you have any of these conditions: -abnormal blood electrolytes -diarrhea or vomiting -gout -heart disease -kidney disease, small amounts of urine, or difficulty passing urine -liver disease -thyroid disease -an unusual or allergic reaction to furosemide, sulfa drugs, other medicines, foods,  dyes, or preservatives -pregnant or trying to get pregnant -breast-feeding How should I use this medicine? Take this medicine by mouth with a glass of water. Follow the directions on the prescription label. You may take this medicine with or without food. If it upsets your stomach, take it with food or milk. Do not take your medicine more often than directed. Remember that you will need to pass more urine after taking this medicine. Do not take your medicine at a time of day that will cause you problems. Do not take at bedtime. Talk to your pediatrician regarding the use of this medicine in children. While this drug may be prescribed for selected conditions, precautions do apply. Overdosage: If you think you have taken too much of this medicine contact a poison control center or emergency room at once. NOTE: This medicine is only for you. Do not share this medicine with others. What if I miss a dose? If you miss a dose, take it as soon as you can. If it is almost time for your next dose, take only that dose. Do not take double or extra doses. What may interact with this medicine? -aspirin and aspirin-like medicines -certain antibiotics -chloral hydrate -cisplatin -cyclosporine -digoxin -diuretics -laxatives -lithium -medicines for blood pressure -medicines that relax muscles for surgery -methotrexate -NSAIDs, medicines for pain and inflammation like ibuprofen, naproxen, or indomethacin -phenytoin -steroid medicines like prednisone or cortisone -sucralfate -thyroid hormones This list may not describe all possible interactions. Give your health care provider a list of all the medicines, herbs, non-prescription drugs, or dietary supplements you use. Also tell them if you smoke, drink alcohol, or use illegal drugs. Some items may interact with your medicine. What should I watch for while using this medicine? Visit your doctor or health care professional for regular checks on your progress.  Check your blood pressure regularly. Ask your doctor or health care professional what your blood pressure should be, and when you should contact him or her. If you are a diabetic, check your blood sugar as directed. You may need to be on a special diet while taking this medicine. Check with your doctor. Also, ask how many glasses of fluid you need to drink a day. You must not get dehydrated. You may get drowsy or dizzy. Do not drive, use machinery, or do anything that needs mental alertness until you know how this drug affects you. Do not stand or sit up quickly, especially if you are an older patient. This reduces the risk of dizzy or fainting spells. Alcohol can make you more drowsy and dizzy. Avoid alcoholic drinks. This medicine can make you more sensitive to the sun. Keep out of the sun. If you cannot avoid being in the sun, wear protective clothing and use sunscreen. Do not use sun lamps or tanning beds/booths. What side effects may I notice from receiving this medicine? Side effects that you should report to your doctor or health care professional as soon as possible: -blood in urine or stools -dry mouth -fever or chills -hearing loss or ringing in the ears -irregular heartbeat -muscle pain or weakness, cramps -skin rash -stomach upset, pain, or nausea -tingling or numbness in  the hands or feet -unusually weak or tired -vomiting or diarrhea -yellowing of the eyes or skin Side effects that usually do not require medical attention (report to your doctor or health care professional if they continue or are bothersome): -headache -loss of appetite -unusual bleeding or bruising This list may not describe all possible side effects. Call your doctor for medical advice about side effects. You may report side effects to FDA at 1-800-FDA-1088. Where should I keep my medicine? Keep out of the reach of children. Store at room temperature between 15 and 30 degrees C (59 and 86 degrees F). Protect  from light. Throw away any unused medicine after the expiration date. NOTE: This sheet is a summary. It may not cover all possible information. If you have questions about this medicine, talk to your doctor, pharmacist, or health care provider.    2016, Elsevier/Gold Standard. (2015-02-02 13:49:50)

## 2016-02-11 NOTE — MAU Note (Signed)
Assumed care of patient.

## 2016-02-11 NOTE — MAU Note (Signed)
Pt states she had a c-section a week ago and has been having SOB ever since.  She states it gets worse at night when she goes to sleep.  Pt states when she gets up and walks around and then sits back down and gets a jabbing pain from the spot she got the epidural and travels up to the back of her neck.  She states that pain usually lasts for 30 seconds.

## 2016-09-07 ENCOUNTER — Encounter (HOSPITAL_COMMUNITY): Payer: Self-pay | Admitting: Emergency Medicine

## 2016-09-07 ENCOUNTER — Emergency Department (HOSPITAL_COMMUNITY)
Admission: EM | Admit: 2016-09-07 | Discharge: 2016-09-08 | Disposition: A | Discharge status: Home / Self Care | Payer: Medicaid Other | Attending: Emergency Medicine | Admitting: Emergency Medicine

## 2016-09-07 DIAGNOSIS — R1010 Upper abdominal pain, unspecified: Secondary | ICD-10-CM

## 2016-09-07 DIAGNOSIS — K5900 Constipation, unspecified: Secondary | ICD-10-CM | POA: Diagnosis not present

## 2016-09-07 LAB — COMPREHENSIVE METABOLIC PANEL
ALK PHOS: 53 U/L (ref 38–126)
ALT: 15 U/L (ref 14–54)
ANION GAP: 9 (ref 5–15)
AST: 22 U/L (ref 15–41)
Albumin: 4 g/dL (ref 3.5–5.0)
BILIRUBIN TOTAL: 0.4 mg/dL (ref 0.3–1.2)
BUN: 11 mg/dL (ref 6–20)
CALCIUM: 9.3 mg/dL (ref 8.9–10.3)
CO2: 24 mmol/L (ref 22–32)
Chloride: 104 mmol/L (ref 101–111)
Creatinine, Ser: 0.59 mg/dL (ref 0.44–1.00)
Glucose, Bld: 112 mg/dL — ABNORMAL HIGH (ref 65–99)
Potassium: 3.6 mmol/L (ref 3.5–5.1)
SODIUM: 137 mmol/L (ref 135–145)
TOTAL PROTEIN: 7.8 g/dL (ref 6.5–8.1)

## 2016-09-07 LAB — CBC
HCT: 33 % — ABNORMAL LOW (ref 36.0–46.0)
HEMOGLOBIN: 10.1 g/dL — AB (ref 12.0–15.0)
MCH: 23.2 pg — AB (ref 26.0–34.0)
MCHC: 30.6 g/dL (ref 30.0–36.0)
MCV: 75.9 fL — ABNORMAL LOW (ref 78.0–100.0)
Platelets: 215 10*3/uL (ref 150–400)
RBC: 4.35 MIL/uL (ref 3.87–5.11)
RDW: 15.2 % (ref 11.5–15.5)
WBC: 8.8 10*3/uL (ref 4.0–10.5)

## 2016-09-07 LAB — URINALYSIS, ROUTINE W REFLEX MICROSCOPIC
BILIRUBIN URINE: NEGATIVE
Glucose, UA: NEGATIVE mg/dL
HGB URINE DIPSTICK: NEGATIVE
Ketones, ur: NEGATIVE mg/dL
Leukocytes, UA: NEGATIVE
Nitrite: NEGATIVE
PROTEIN: NEGATIVE mg/dL
Specific Gravity, Urine: 1.026 (ref 1.005–1.030)
pH: 6 (ref 5.0–8.0)

## 2016-09-07 LAB — POC URINE PREG, ED: PREG TEST UR: NEGATIVE

## 2016-09-07 LAB — LIPASE, BLOOD: Lipase: 18 U/L (ref 11–51)

## 2016-09-07 NOTE — ED Triage Notes (Signed)
Pt. reports upper abdominal and mid back pain onset today , denies emesis or diarrhea , no fever or chills. Pt. stated history of gallstones .

## 2016-09-08 ENCOUNTER — Emergency Department (HOSPITAL_COMMUNITY): Payer: Medicaid Other

## 2016-09-08 MED ORDER — POLYETHYLENE GLYCOL 3350 17 GM/SCOOP PO POWD: 17.0000 g | Freq: Every day | ORAL | 0 refills | Status: DC

## 2016-09-08 MED ORDER — GI COCKTAIL ~~LOC~~
30.0000 mL | Freq: Once | ORAL | Status: AC
  Administered 2016-09-08: 30 mL via ORAL
  Filled 2016-09-08: qty 30

## 2016-09-08 MED ORDER — PANTOPRAZOLE SODIUM 20 MG PO TBEC: 20.0000 mg | DELAYED_RELEASE_TABLET | Freq: Every day | ORAL | 0 refills | Status: DC

## 2016-09-08 NOTE — ED Notes (Signed)
Patient transported to X-ray 

## 2016-09-08 NOTE — ED Provider Notes (Signed)
[facility] DEPT Provider Note   CSN: [id number] Arrival date & time: 09/07/16  2027     History   Chief Complaint Chief Complaint  Patient presents with  . Abdominal Pain    HPI Alyssa Hubbard is a 25 y.o. female.  HPI   Patient is a 25 year old female who presents emergency Department complaining of  generalized upper abdominal pain described as cramping and aching that began 4 hours prior to arrival in the ER.  She ate 3-4 hours prior to the onset of abdominal pain. She reports chronic constipation and has last had a bowel movement and 9 PM stated it was hard and she had to strain.  No fever, chills, sweats, nausea, vomiting, diarrhea, dysuria, hematuria, flank pain, vaginal symptoms.    Past Medical History:  Diagnosis Date  . Anemia    states "supposed to be on Iron, but has not been taking it".  . Fibroid    "I have had them for years".   Kennyth Arnold stones   . Pregnancy induced hypertension    with first preg    Patient Active Problem List   Diagnosis Date Noted  . Active labor 02/02/2016  . Twin pregnancy, delivered by cesarean section, current hospitalization 02/02/2016  . Postpartum care following cesarean delivery 02/02/2016  . Pregnancy 02/21/2015  . SVD (spontaneous vaginal delivery) 02/21/2015  . Screening, antenatal, for fetal anatomic survey   . Low lying placenta with hemorrhage in third trimester, antepartum     Past Surgical History:  Procedure Laterality Date  . CESAREAN SECTION N/A 02/02/2016   Procedure: CESAREAN SECTION;  Surgeon: Sherlyn Hay, DO;  Location: [facility];  Service: Obstetrics;  Laterality: N/A;  . NO PAST SURGERIES      OB History    Gravida Para Term Preterm AB Living   3 3 3  0 0 4   SAB TAB Ectopic Multiple Live Births   0 0 0 1 4       Home Medications    Prior to Admission medications   Medication Sig Start Date End Date Taking? Authorizing Provider  ferrous sulfate 325 (65 FE) MG EC tablet Take 1 tablet (325  mg total) by mouth 2 (two) times daily. Patient not taking: Reported on 02/02/2016 02/23/15   Newton Pigg, MD  furosemide (LASIX) 40 MG tablet Take 1 tablet (40 mg total) by mouth daily. 02/11/16 02/18/16  Lori A Clemmons, CNM  ibuprofen (ADVIL,MOTRIN) 600 MG tablet Take 1 tablet (600 mg total) by mouth every 6 (six) hours. 02/05/16   Cheri Fowler, MD  oxyCODONE (OXY IR/ROXICODONE) 5 MG immediate release tablet Take 1 tablet (5 mg total) by mouth every 4 (four) hours as needed for severe pain. 02/05/16   Cheri Fowler, MD  pantoprazole (PROTONIX) 20 MG tablet Take 1 tablet (20 mg total) by mouth daily. 09/08/16   Delsa Grana, PA-C  polyethylene glycol powder (GLYCOLAX/MIRALAX) powder Take 17 g by mouth daily. 09/08/16   Delsa Grana, PA-C  Prenatal Vit-Fe Fumarate-FA (PRENATAL MULTIVITAMIN) TABS tablet Take 1 tablet by mouth daily at 12 noon. 02/23/15   Newton Pigg, MD    Family History Family History  Problem Relation Age of Onset  . Diabetes Sister   . Asthma Sister   . Arthritis Neg Hx   . Alcohol abuse Neg Hx   . Birth defects Neg Hx   . Cancer Neg Hx   . COPD Neg Hx   . Depression Neg Hx   . Drug abuse Neg  Hx   . Early death Neg Hx   . Hearing loss Neg Hx   . Heart disease Neg Hx   . Hyperlipidemia Neg Hx   . Hypertension Neg Hx   . Kidney disease Neg Hx   . Learning disabilities Neg Hx   . Mental illness Neg Hx   . Mental retardation Neg Hx   . Miscarriages / Stillbirths Neg Hx   . Stroke Neg Hx   . Vision loss Neg Hx   . Varicose Veins Neg Hx     Social History Social History  Substance Use Topics  . Smoking status: Never Smoker  . Smokeless tobacco: Never Used  . Alcohol use No     Allergies   Review of patient's allergies indicates no known allergies.   Review of Systems Review of Systems  All other systems reviewed and are negative.    Physical Exam Updated Vital Signs BP 125/78   Pulse 93   Temp 98.2 F (36.8 C) (Oral)   Resp 18   Ht 5\' 8"   (1.727 m)   Wt 115.7 kg   LMP 08/31/2016 (Approximate)   SpO2 100%   BMI 38.77 kg/m   Physical Exam  Constitutional: She is oriented to person, place, and time. She appears well-developed and well-nourished. No distress.  HENT:  Head: Normocephalic and atraumatic.  Right Ear: External ear normal.  Left Ear: External ear normal.  Nose: Nose normal.  Mouth/Throat: Oropharynx is clear and moist.  Eyes: Conjunctivae and EOM are normal. Pupils are equal, round, and reactive to light. Right eye exhibits no discharge. Left eye exhibits no discharge. No scleral icterus.  Neck: Normal range of motion. Neck supple.  Cardiovascular: Normal rate, regular rhythm, normal heart sounds and intact distal pulses.  Exam reveals no friction rub.   No murmur heard. Pulmonary/Chest: Effort normal and breath sounds normal. No stridor. No respiratory distress. She has no wheezes. She has no rales. She exhibits no tenderness.  Abdominal: Soft. Bowel sounds are normal. She exhibits no distension and no mass. There is tenderness. There is no rebound and no guarding.  Right upper quadrant and epigastric tenderness to palpation, no guarding, no rebound tenderness, negative Murphy's, no CVA tenderness bilaterally  Musculoskeletal: Normal range of motion. She exhibits no edema.  Neurological: She is alert and oriented to person, place, and time. She exhibits normal muscle tone. Coordination normal.  Skin: Skin is warm and dry. Capillary refill takes less than 2 seconds. No rash noted. She is not diaphoretic. No erythema. No pallor.  Psychiatric: She has a normal mood and affect. Her behavior is normal. Judgment and thought content normal.  Nursing note and vitals reviewed.    ED Treatments / Results  Labs (all labs ordered are listed, but only abnormal results are displayed) Labs Reviewed  COMPREHENSIVE METABOLIC PANEL - Abnormal; Notable for the following:       Result Value   Glucose, Bld 112 (*)    All  other components within normal limits  CBC - Abnormal; Notable for the following:    Hemoglobin 10.1 (*)    HCT 33.0 (*)    MCV 75.9 (*)    MCH 23.2 (*)    All other components within normal limits  LIPASE, BLOOD  URINALYSIS, ROUTINE W REFLEX MICROSCOPIC (NOT AT [facility])  POC URINE PREG, ED    EKG  EKG Interpretation None       Radiology Dg Abdomen Acute W/chest  Result Date: 09/08/2016 CLINICAL DATA:  Acute onset of epigastric pain and bilateral upper quadrant abdominal pain, radiating to the back. Constipation. Initial encounter. EXAM: DG ABDOMEN ACUTE W/ 1V CHEST COMPARISON:  Chest radiograph performed 02/11/2016 FINDINGS: The lungs are well-aerated and clear. There is no evidence of focal opacification, pleural effusion or pneumothorax. The cardiomediastinal silhouette is within normal limits. The visualized bowel gas pattern is unremarkable. Scattered stool and air are seen within the colon; there is no evidence of small bowel dilatation to suggest obstruction. No free intra-abdominal air is identified on the provided upright view. No acute osseous abnormalities are seen; the sacroiliac joints are unremarkable in appearance. Calcified uterine fibroids are noted. IMPRESSION: 1. Unremarkable bowel gas pattern; no free intra-abdominal air seen. Small to moderate amount of stool noted in the colon. 2. No acute cardiopulmonary process seen. 3. Calcified uterine fibroids noted. Electronically Signed   By: Garald Balding M.D.   On: 09/08/2016 01:20    Procedures Procedures (including critical care time)  Medications Ordered in ED Medications  gi cocktail (Maalox,Lidocaine,Donnatal) (30 mLs Oral Given 09/08/16 0134)     Initial Impression / Assessment and Plan / ED Course  I have reviewed the triage vital signs and the nursing notes.  Pertinent labs & imaging results that were available during my care of the patient were reviewed by me and considered in my medical decision making (see  chart for details).  Clinical Course   Patient with generalized upper abdominal pain for the past 4 hours also complains of constipation, last bowel movement 4 hours prior to arrival she reported straining.  No chest pain shortness of breath, nausea, vomiting, diarrhea, fever, chills or sweats. Pain is not temporally related to eating, she denies acid reflux currently. She denies urinary complaints, denies vaginal complaints.  No surgical abdominal history.  She reported some right upper quadrant tenderness on exam there was no guarding or rebound tenderness, no CVA tenderness.  She is well-appearing stable vital signs, afebrile, tolerating food and fluid intake without any worsening pain. X-ray was obtained to evaluate constipation/stool burden.  She was given a GI cocktail.  Lab work was unremarkable, anemia is improved from her recent labs,  urinalysis was negative, plain films show small to moderate amount of stool in colon.    Have no concern at this time for acute cholecystitis with negative labs and unremarkable physical exam. She was encouraged to obtain a general surgery referral if she begins to have biliary colic.  She was prescribed MiraLAX and encouraged to increase fluid intake for her constipation.    She was discharged home in good condition with stable vital signs.   Final Clinical Impressions(s) / ED Diagnoses   Final diagnoses:  Constipation, unspecified constipation type  Pain of upper abdomen    New Prescriptions Discharge Medication List as of 09/08/2016 12:58 AM    START taking these medications   Details  pantoprazole (PROTONIX) 20 MG tablet Take 1 tablet (20 mg total) by mouth daily., Starting Sat 09/08/2016, Print    polyethylene glycol powder (GLYCOLAX/MIRALAX) powder Take 17 g by mouth daily., Starting Sat 09/08/2016, Print         Delsa Grana, PA-C 09/08/16 [place], MD 09/09/16 [phone]

## 2016-09-27 ENCOUNTER — Other Ambulatory Visit: Payer: Self-pay | Admitting: Obstetrics and Gynecology

## 2016-09-27 DIAGNOSIS — N6452 Nipple discharge: Secondary | ICD-10-CM

## 2016-10-08 ENCOUNTER — Other Ambulatory Visit: Payer: Self-pay | Admitting: Obstetrics and Gynecology

## 2016-10-08 ENCOUNTER — Ambulatory Visit
Admission: RE | Admit: 2016-10-08 | Discharge: 2016-10-08 | Disposition: A | Discharge status: Home / Self Care | Payer: Medicaid Other | Source: Ambulatory Visit | Attending: Obstetrics and Gynecology | Admitting: Obstetrics and Gynecology

## 2016-10-08 DIAGNOSIS — N6452 Nipple discharge: Secondary | ICD-10-CM

## 2016-10-12 ENCOUNTER — Other Ambulatory Visit: Payer: Self-pay | Admitting: Obstetrics and Gynecology

## 2016-10-12 DIAGNOSIS — N6452 Nipple discharge: Secondary | ICD-10-CM

## 2016-11-01 ENCOUNTER — Ambulatory Visit
Admission: RE | Admit: 2016-11-01 | Discharge: 2016-11-01 | Disposition: A | Discharge status: Home / Self Care | Payer: Medicaid Other | Source: Ambulatory Visit | Attending: Obstetrics and Gynecology | Admitting: Obstetrics and Gynecology

## 2016-11-01 DIAGNOSIS — N6452 Nipple discharge: Secondary | ICD-10-CM

## 2016-11-01 MED ORDER — GADOBENATE DIMEGLUMINE 529 MG/ML IV SOLN: 20.0000 mL | Freq: Once | INTRAVENOUS | Status: DC | PRN

## 2017-06-14 ENCOUNTER — Other Ambulatory Visit: Payer: Self-pay | Admitting: Family Medicine

## 2017-06-14 ENCOUNTER — Other Ambulatory Visit (HOSPITAL_COMMUNITY): Payer: Self-pay | Admitting: Family Medicine

## 2017-06-14 ENCOUNTER — Ambulatory Visit
Admission: RE | Admit: 2017-06-14 | Discharge: 2017-06-14 | Disposition: A | Discharge status: Home / Self Care | Payer: 59 | Source: Ambulatory Visit | Attending: Family Medicine | Admitting: Family Medicine

## 2017-06-14 DIAGNOSIS — M25562 Pain in left knee: Secondary | ICD-10-CM

## 2017-06-14 MED ORDER — IOPAMIDOL (ISOVUE-M 300) INJECTION 61%: 1.0000 mL | Freq: Once | INTRAMUSCULAR | Status: DC | PRN

## 2017-06-14 MED ORDER — TRIAMCINOLONE ACETONIDE 40 MG/ML IJ SUSP (RADIOLOGY): 120.0000 mg | Freq: Once | INTRAMUSCULAR | Status: DC

## 2017-11-26 HISTORY — DX: Maternal care for unspecified type scar from previous cesarean delivery: O34.219

## 2017-11-26 NOTE — L&D Delivery Note (Signed)
Delivery Note Pt pushed for 10-38mins and at 10:28 AM a viable female was delivered via Vaginal, Spontaneous (Presentation:LOA ;  ).  APGAR:9,9 , ; weight pending  .   Placenta status:delivered, intact, schultz , .  Cord: 3vc with the following complications: true knot - loose.  Cord pH: n/a  Anesthesia:  none Episiotomy: None Lacerations: None Suture Repair: n/a Est. Blood Loss (mL): 150  Mom to postpartum.  Baby to Couplet care / Skin to Skin  Circ in office.  Isaiah Serge 08/04/2018, 10:40 AM

## 2018-08-04 ENCOUNTER — Inpatient Hospital Stay (HOSPITAL_COMMUNITY)
Admission: AD | Admit: 2018-08-04 | Discharge: 2018-08-05 | DRG: 807 | Disposition: A | Discharge status: Home / Self Care | Payer: Medicaid Other | Attending: Obstetrics and Gynecology | Admitting: Obstetrics and Gynecology

## 2018-08-04 ENCOUNTER — Other Ambulatory Visit: Payer: Self-pay

## 2018-08-04 ENCOUNTER — Encounter (HOSPITAL_COMMUNITY): Payer: Self-pay | Admitting: *Deleted

## 2018-08-04 DIAGNOSIS — O34219 Maternal care for unspecified type scar from previous cesarean delivery: Secondary | ICD-10-CM

## 2018-08-04 DIAGNOSIS — D251 Intramural leiomyoma of uterus: Secondary | ICD-10-CM | POA: Diagnosis present

## 2018-08-04 DIAGNOSIS — D649 Anemia, unspecified: Secondary | ICD-10-CM | POA: Diagnosis present

## 2018-08-04 DIAGNOSIS — O9902 Anemia complicating childbirth: Secondary | ICD-10-CM | POA: Diagnosis present

## 2018-08-04 DIAGNOSIS — O34211 Maternal care for low transverse scar from previous cesarean delivery: Principal | ICD-10-CM | POA: Diagnosis present

## 2018-08-04 DIAGNOSIS — O3413 Maternal care for benign tumor of corpus uteri, third trimester: Secondary | ICD-10-CM | POA: Diagnosis present

## 2018-08-04 DIAGNOSIS — Z3A39 39 weeks gestation of pregnancy: Secondary | ICD-10-CM

## 2018-08-04 DIAGNOSIS — O99824 Streptococcus B carrier state complicating childbirth: Secondary | ICD-10-CM | POA: Diagnosis present

## 2018-08-04 HISTORY — DX: Encounter for other specified aftercare: Z51.89

## 2018-08-04 LAB — COMPREHENSIVE METABOLIC PANEL
ALBUMIN: 3.1 g/dL — AB (ref 3.5–5.0)
ALT: 9 U/L (ref 0–44)
ANION GAP: 10 (ref 5–15)
AST: 13 U/L — ABNORMAL LOW (ref 15–41)
Alkaline Phosphatase: 73 U/L (ref 38–126)
BUN: 9 mg/dL (ref 6–20)
CO2: 18 mmol/L — AB (ref 22–32)
Calcium: 8.7 mg/dL — ABNORMAL LOW (ref 8.9–10.3)
Chloride: 105 mmol/L (ref 98–111)
Creatinine, Ser: 0.31 mg/dL — ABNORMAL LOW (ref 0.44–1.00)
GFR calc non Af Amer: >60 mL/min (ref >60)
Glucose, Bld: 86 mg/dL (ref 70–99)
Potassium: 3.9 mmol/L (ref 3.5–5.1)
Sodium: 133 mmol/L — ABNORMAL LOW (ref 135–145)
Total Bilirubin: 0.4 mg/dL (ref 0.3–1.2)
Total Protein: 6.8 g/dL (ref 6.5–8.1)

## 2018-08-04 LAB — RPR: RPR Ser Ql: NONREACTIVE

## 2018-08-04 LAB — CBC
HCT: 27.3 % — ABNORMAL LOW (ref 36.0–46.0)
HEMOGLOBIN: 8.2 g/dL — AB (ref 12.0–15.0)
MCH: 22.7 pg — AB (ref 26.0–34.0)
MCHC: 30 g/dL (ref 30.0–36.0)
MCV: 75.4 fL — AB (ref 78.0–100.0)
Platelets: 140 10*3/uL — ABNORMAL LOW (ref 150–400)
RBC: 3.62 MIL/uL — AB (ref 3.87–5.11)
RDW: 16 % — ABNORMAL HIGH (ref 11.5–15.5)
WBC: 7.2 10*3/uL (ref 4.0–10.5)

## 2018-08-04 LAB — PROTEIN / CREATININE RATIO, URINE
Creatinine, Urine: 201 mg/dL
Protein Creatinine Ratio: 0.1 mg/mg{Cre} (ref 0.00–0.15)
Total Protein, Urine: 20 mg/dL

## 2018-08-04 LAB — TYPE AND SCREEN
ABO/RH(D): A POS
Antibody Screen: NEGATIVE

## 2018-08-04 MED ORDER — SENNOSIDES-DOCUSATE SODIUM 8.6-50 MG PO TABS
2.0000 | ORAL_TABLET | ORAL | Status: DC
  Administered 2018-08-04: 2 via ORAL
  Filled 2018-08-04: qty 2

## 2018-08-04 MED ORDER — OXYCODONE HCL 5 MG PO TABS: 5.0000 mg | ORAL_TABLET | ORAL | Status: DC | PRN

## 2018-08-04 MED ORDER — SODIUM CHLORIDE 0.9 % IV SOLN
5.0000 10*6.[IU] | Freq: Once | INTRAVENOUS | Status: AC
  Administered 2018-08-04: 5 10*6.[IU] via INTRAVENOUS
  Filled 2018-08-04: qty 5

## 2018-08-04 MED ORDER — ACETAMINOPHEN 325 MG PO TABS: 650.0000 mg | ORAL_TABLET | ORAL | Status: DC | PRN

## 2018-08-04 MED ORDER — TETANUS-DIPHTH-ACELL PERTUSSIS 5-2.5-18.5 LF-MCG/0.5 IM SUSP: 0.5000 mL | Freq: Once | INTRAMUSCULAR | Status: DC

## 2018-08-04 MED ORDER — OXYTOCIN 40 UNITS IN LACTATED RINGERS INFUSION - SIMPLE MED
2.5000 [IU]/h | INTRAVENOUS | Status: DC
  Filled 2018-08-04: qty 1000

## 2018-08-04 MED ORDER — WITCH HAZEL-GLYCERIN EX PADS: 1.0000 "application " | MEDICATED_PAD | CUTANEOUS | Status: DC | PRN

## 2018-08-04 MED ORDER — OXYCODONE-ACETAMINOPHEN 5-325 MG PO TABS: 1.0000 | ORAL_TABLET | ORAL | Status: DC | PRN

## 2018-08-04 MED ORDER — LACTATED RINGERS IV SOLN: 500.0000 mL | INTRAVENOUS | Status: DC | PRN

## 2018-08-04 MED ORDER — PENICILLIN G 3 MILLION UNITS IVPB - SIMPLE MED
3.0000 10*6.[IU] | INTRAVENOUS | Status: DC
  Administered 2018-08-04: 3 10*6.[IU] via INTRAVENOUS
  Filled 2018-08-04: qty 3
  Filled 2018-08-04: qty 100

## 2018-08-04 MED ORDER — ONDANSETRON HCL 4 MG PO TABS: 4.0000 mg | ORAL_TABLET | ORAL | Status: DC | PRN

## 2018-08-04 MED ORDER — SIMETHICONE 80 MG PO CHEW: 80.0000 mg | CHEWABLE_TABLET | ORAL | Status: DC | PRN

## 2018-08-04 MED ORDER — FLEET ENEMA 7-19 GM/118ML RE ENEM: 1.0000 | ENEMA | RECTAL | Status: DC | PRN

## 2018-08-04 MED ORDER — DIPHENHYDRAMINE HCL 25 MG PO CAPS: 25.0000 mg | ORAL_CAPSULE | Freq: Four times a day (QID) | ORAL | Status: DC | PRN

## 2018-08-04 MED ORDER — LIDOCAINE HCL (PF) 1 % IJ SOLN
30.0000 mL | INTRAMUSCULAR | Status: DC | PRN
  Filled 2018-08-04: qty 30

## 2018-08-04 MED ORDER — LACTATED RINGERS IV SOLN
INTRAVENOUS | Status: DC
  Administered 2018-08-04: 05:00:00 via INTRAVENOUS

## 2018-08-04 MED ORDER — PRENATAL MULTIVITAMIN CH
1.0000 | ORAL_TABLET | Freq: Every day | ORAL | Status: DC
  Administered 2018-08-05: 1 via ORAL

## 2018-08-04 MED ORDER — ONDANSETRON HCL 4 MG/2ML IJ SOLN: 4.0000 mg | INTRAMUSCULAR | Status: DC | PRN

## 2018-08-04 MED ORDER — OXYCODONE HCL 5 MG PO TABS: 10.0000 mg | ORAL_TABLET | ORAL | Status: DC | PRN

## 2018-08-04 MED ORDER — OXYTOCIN BOLUS FROM INFUSION
500.0000 mL | Freq: Once | INTRAVENOUS | Status: AC
  Administered 2018-08-04: 500 mL via INTRAVENOUS

## 2018-08-04 MED ORDER — OXYCODONE-ACETAMINOPHEN 5-325 MG PO TABS: 2.0000 | ORAL_TABLET | ORAL | Status: DC | PRN

## 2018-08-04 MED ORDER — BUTORPHANOL TARTRATE 1 MG/ML IJ SOLN: 1.0000 mg | INTRAMUSCULAR | Status: DC | PRN

## 2018-08-04 MED ORDER — IBUPROFEN 600 MG PO TABS
600.0000 mg | ORAL_TABLET | Freq: Four times a day (QID) | ORAL | Status: DC
  Administered 2018-08-04 – 2018-08-05 (×4): 600 mg via ORAL
  Filled 2018-08-04 (×4): qty 1

## 2018-08-04 MED ORDER — BENZOCAINE-MENTHOL 20-0.5 % EX AERO: 1.0000 "application " | INHALATION_SPRAY | CUTANEOUS | Status: DC | PRN

## 2018-08-04 MED ORDER — SOD CITRATE-CITRIC ACID 500-334 MG/5ML PO SOLN: 30.0000 mL | ORAL | Status: DC | PRN

## 2018-08-04 MED ORDER — COCONUT OIL OIL: 1.0000 "application " | TOPICAL_OIL | Status: DC | PRN

## 2018-08-04 MED ORDER — ONDANSETRON HCL 4 MG/2ML IJ SOLN: 4.0000 mg | Freq: Four times a day (QID) | INTRAMUSCULAR | Status: DC | PRN

## 2018-08-04 MED ORDER — DIBUCAINE 1 % RE OINT: 1.0000 "application " | TOPICAL_OINTMENT | RECTAL | Status: DC | PRN

## 2018-08-04 MED ORDER — ZOLPIDEM TARTRATE 5 MG PO TABS: 5.0000 mg | ORAL_TABLET | Freq: Every evening | ORAL | Status: DC | PRN

## 2018-08-04 NOTE — Progress Notes (Addendum)
Patient ID: Alyssa Hubbard, female   DOB: [birth year], 27 y.o.   MRN: [id number] Pt is appreciating contractions and fetal movements. No complaints. Plans to continue to try to labor with no medication. Breathing well with contractions VSS EFM - 120, cat 1 TOCO - contractions q 3-4 mins SVE - 8/100/0  A/P: 06YI [id number] for TOLAC progressing well         AROM performed with clear fluid return         S/P PCN for GBS tx x 1; next due 930am; give now         Anticipate svd

## 2018-08-04 NOTE — H&P (Signed)
Alyssa Hubbard is a 27 y.o. female [phone] at 39+ with ctx, cervix dilated to 4.5cm.  Pt is TOLAC.  Has had SVD x 2, then LTCS for twin pregnancy.  Relatively uncomplicated PNC.  Has signed VBAC consent.  Small intramural fibroids.    OB History    Gravida  4   Para  3   Term  3   Preterm  0   AB  0   Living  4     SAB  0   TAB  0   Ectopic  0   Multiple  1   Live Births  4         G1 2014 female 7#9 SVD G2 2016 female 6#13 SVD G3 2017 female 5#14, female 6#11; LTCS G4 present  No abn pap No STD  Past Medical History:  Diagnosis Date  . Anemia    states "supposed to be on Iron, but has not been taking it".  . Blood transfusion without reported diagnosis 2017   emergency c section placental abruption  . Fibroid    "I have had them for years".   Kennyth Arnold stones   . Pregnancy induced hypertension    with first preg   Past Surgical History:  Procedure Laterality Date  . CESAREAN SECTION N/A 02/02/2016   Procedure: CESAREAN SECTION;  Surgeon: Sherlyn Hay, DO;  Location: [facility];  Service: Obstetrics;  Laterality: N/A;  . NO PAST SURGERIES     Family History: family history includes Asthma in her sister; Diabetes in her maternal grandmother and sister; Miscarriages / Stillbirths in her sister; Stroke in her maternal grandmother. Social History:  reports that she has never smoked. She has never used smokeless tobacco. She reports that she does not drink alcohol or use drugs. married Meds PNV, Iron All NKDA     Maternal Diabetes: No Genetic Screening: Declined Maternal Ultrasounds/Referrals: Normal Fetal Ultrasounds or other Referrals:  None Maternal Substance Abuse:  No Significant Maternal Medications:  None Significant Maternal Lab Results:  Lab values include: Group B Strep positive Other Comments:  None  Review of Systems  Constitutional: Negative.   HENT: Negative.   Eyes: Negative.   Respiratory: Negative.   Cardiovascular: Negative.    Gastrointestinal: Positive for abdominal pain.  Genitourinary: Negative.   Musculoskeletal: Positive for back pain.  Skin: Negative.   Neurological: Negative.   Psychiatric/Behavioral: Negative.    Maternal Medical History:  Reason for admission: Contractions.   Contractions: Frequency: irregular.   Perceived severity is moderate.    Fetal activity: Perceived fetal activity is normal.    Prenatal Complications - Diabetes: none.    Dilation: 4.5 Effacement (%): 70 Station: -2 Exam by:: [employer], RN  Blood pressure 133/88, pulse 90, temperature 98.4 F (36.9 C), temperature source Oral, resp. rate 18, height 5\' 8"  (1.727 m), weight 121.6 kg, last menstrual period 11/01/2017, unknown if currently breastfeeding. Maternal Exam:  Uterine Assessment: Contraction frequency is irregular.   Abdomen: Patient reports no abdominal tenderness. Fundal height is appropriate for gestation.   Estimated fetal weight is 6.5-7.5#.   Fetal presentation: vertex  Introitus: Normal vulva. Normal vagina.    Physical Exam  Constitutional: She is oriented to person, place, and time. She appears well-developed and well-nourished.  HENT:  Head: Normocephalic and atraumatic.  Cardiovascular: Normal rate and regular rhythm.  Respiratory: Effort normal and breath sounds normal. No respiratory distress. She has no wheezes.  GI: Soft. Bowel sounds are normal. She exhibits no  distension. There is no tenderness.  Musculoskeletal: Normal range of motion.  Neurological: She is alert and oriented to person, place, and time.  Skin: Skin is warm and dry.  Psychiatric: She has a normal mood and affect. Her behavior is normal.    Prenatal labs: ABO, Rh:  A+ Antibody:  negative Rubella:  immune RPR:   NR HBsAg:   neg HIV:   neg GBS:   positive  Hgb 10.3/Plt 198/Ur cx neg/GC neg/Chl neg/Varicella immune/Hgb electro WNL/glucola 119/ Nl anat post plac, gender reveal - somewhat limited anat Completes  anat Growth [place]   Assessment/Plan: 37VO [id number] at 39+ with ctx, desires TOLAC gbbs + - PCN for prophylaxis AROM/?pitocin to augment Expect SVD   Alyssa Hubbard 08/04/2018, 6:06 AM

## 2018-08-04 NOTE — Lactation Note (Addendum)
This note was copied from a baby's chart. Lactation Consultation Note  Patient Name: Alyssa Hubbard [id number] Date: 08/04/2018 Reason for consult: Initial assessment;Term  P5 mother whose infant is now 59 hours old.  Mother breastfed her other children from a couple of weeks - 8 months.  She hopes to breastfeed this baby for 1 year.  Baby is sleeping and not showing feeding cues at this time.  Encouraged to feed 8-12 times/24 hours or sooner if baby shows cues.  Reviewed feeding cues.  Mother is familiar with hand expression and I encouraged this before/after feeds.  Colostrum container and spoon provided.  Mother will feed back any EBM she obtains with hand expression or pumping.  Mother wanted to check flange size so she could order some online.  I provided a manual pump with instructions for use and fitted her appropriately here in the hospital.  Changed her flange size from a #24 to a #27 with comfort.  Demonstrated using the pump and mother had drops of colostrum almost immediately.  Pump parts, assembly, disassembly and cleaning reviewed.    Mom made aware of O/P services, breastfeeding support groups, community resources, and our phone # for post-discharge questions.   Mother will call for latch assistance as needed.  Father present.     Maternal Data Formula Feeding for Exclusion: No Has patient been taught Hand Expression?: Yes Does the patient have breastfeeding experience prior to this delivery?: Yes  Feeding Feeding Type: Breast Fed Length of feed: 0 min  LATCH Score                   Interventions    Lactation Tools Discussed/Used Pump Review: Setup, frequency, and cleaning;Milk Storage Initiated by:: Alyssa Hubbard Date initiated:: 08/05/18   Consult Status Consult Status: Follow-up Date: 08/05/18 Follow-up type: In-patient    Little Ishikawa 08/04/2018, 6:26 PM

## 2018-08-05 LAB — CBC
HEMATOCRIT: 25.3 % — AB (ref 36.0–46.0)
HEMOGLOBIN: 7.8 g/dL — AB (ref 12.0–15.0)
MCH: 23.1 pg — ABNORMAL LOW (ref 26.0–34.0)
MCHC: 30.8 g/dL (ref 30.0–36.0)
MCV: 75.1 fL — ABNORMAL LOW (ref 78.0–100.0)
Platelets: 127 10*3/uL — ABNORMAL LOW (ref 150–400)
RBC: 3.37 MIL/uL — ABNORMAL LOW (ref 3.87–5.11)
RDW: 16.1 % — ABNORMAL HIGH (ref 11.5–15.5)
WBC: 7.3 10*3/uL (ref 4.0–10.5)

## 2018-08-05 MED ORDER — IBUPROFEN 600 MG PO TABS: 600.0000 mg | ORAL_TABLET | Freq: Four times a day (QID) | ORAL | 0 refills | Status: DC

## 2018-08-05 MED ORDER — ACETAMINOPHEN 325 MG PO TABS: 650.0000 mg | ORAL_TABLET | ORAL | 0 refills | Status: DC | PRN

## 2018-08-05 NOTE — Progress Notes (Signed)
Post Partum Day 1 Subjective: no complaints, up ad lib and tolerating PO.  Requests early d/c  Objective: Blood pressure 132/78, pulse 79, temperature 98.1 F (36.7 C), temperature source Oral, resp. rate 18, height 5\' 8"  (1.727 m), weight 121.6 kg, last menstrual period 11/01/2017, SpO2 100 %, unknown if currently breastfeeding.  Physical Exam:  General: alert and cooperative Lochia: appropriate Uterine Fundus: firm   Recent Labs    08/04/18 0210 08/05/18 0527  HGB 8.2* 7.8*  HCT 27.3* 25.3*    Assessment/Plan: Discharge home  Hemoglobin stable though has baseline anemia    LOS: 1 day   Logan Bores 08/05/2018, 9:55 AM

## 2018-08-05 NOTE — Discharge Summary (Signed)
OB Discharge Summary     Patient Name: Alyssa Hubbard DOB: 07-Jul-1991 MRN: [id number]  Date of admission: 08/04/2018 Delivering MD: Carlynn Purl [facility]   Date of discharge: 08/05/2018  Admitting diagnosis: 39wks ctx Intrauterine pregnancy: [redacted]w[redacted]d     Secondary diagnosis:  Active Problems:   Indication for care in labor or delivery   VBAC (vaginal birth after Cesarean)   Postpartum care following vaginal delivery  Additional problems: none     Discharge diagnosis: Term Pregnancy Delivered                                                                                                Post partum procedures:none  Augmentation: AROM  Complications: None  Hospital course:  Onset of Labor With Vaginal Delivery     27 y.o. yo [id number] at [redacted]w[redacted]d was admitted in Active Labor on 08/04/2018. Patient had an uncomplicated labor course as follows:  Membrane Rupture Time/Date: 9:01 AM ,08/04/2018   Intrapartum Procedures: Episiotomy: None [1]                                         Lacerations:  None [1]  Patient had a delivery of a Viable infant. 08/04/2018  Information for the patient's newborn:  Shermika, [place] [[id number]]  Delivery Method: VBAC, Spontaneous(Filed from Delivery Summary)    Pateint had an uncomplicated postpartum course.  She is ambulating, tolerating a regular diet, passing flatus, and urinating well. Patient is discharged home in stable condition on 08/05/18.   Physical exam  Vitals:   08/04/18 1409 08/04/18 1528 08/04/18 2123 08/05/18 0536  BP: 136/67 134/77 (!) 142/77 132/78  Pulse: 97 (!) 104 (!) 109 79  Resp: 20 18 18 18   Temp: 98.2 F (36.8 C) 98.4 F (36.9 C) 98.3 F (36.8 C) 98.1 F (36.7 C)  TempSrc:   Oral Oral  SpO2:      Weight:      Height:       General: alert and cooperative Lochia: appropriate Uterine Fundus: firm  Labs: Lab Results  Component Value Date   WBC 7.3 08/05/2018   HGB 7.8 (L) 08/05/2018   HCT 25.3 (L) 08/05/2018   MCV  75.1 (L) 08/05/2018   PLT 127 (L) 08/05/2018   CMP Latest Ref Rng & Units 08/04/2018  Glucose 70 - 99 mg/dL 86  BUN 6 - 20 mg/dL 9  Creatinine 0.44 - 1.00 mg/dL 0.31(L)  Sodium 135 - 145 mmol/L 133(L)  Potassium 3.5 - 5.1 mmol/L 3.9  Chloride 98 - 111 mmol/L 105  CO2 22 - 32 mmol/L 18(L)  Calcium 8.9 - 10.3 mg/dL 8.7(L)  Total Protein 6.5 - 8.1 g/dL 6.8  Total Bilirubin 0.3 - 1.2 mg/dL 0.4  Alkaline Phos 38 - 126 U/L 73  AST 15 - 41 U/L 13(L)  ALT 0 - 44 U/L 9    Discharge instruction: per After Visit Summary and "Baby and Me Booklet".  After visit meds:  Allergies as of 08/05/2018   No  Known Allergies     Medication List    STOP taking these medications   furosemide 40 MG tablet Commonly known as:  LASIX   oxyCODONE 5 MG immediate release tablet Commonly known as:  Oxy IR/ROXICODONE   pantoprazole 20 MG tablet Commonly known as:  PROTONIX   polyethylene glycol powder powder Commonly known as:  GLYCOLAX/MIRALAX     TAKE these medications   acetaminophen 325 MG tablet Commonly known as:  TYLENOL Take 2 tablets (650 mg total) by mouth every 4 (four) hours as needed (for pain scale < 4).   ferrous sulfate 325 (65 FE) MG EC tablet Take 1 tablet (325 mg total) by mouth 2 (two) times daily. What changed:  when to take this   ibuprofen 600 MG tablet Commonly known as:  ADVIL,MOTRIN Take 1 tablet (600 mg total) by mouth every 6 (six) hours.   prenatal multivitamin Tabs tablet Take 1 tablet by mouth daily at 12 noon.       Diet: routine diet  Activity: Advance as tolerated. Pelvic rest for 6 weeks.   Outpatient follow up:6 weeks Follow up Appt:No future appointments. Follow up Visit:No follow-ups on file.  Postpartum contraception: Undecided  Newborn Data: Live born female  Birth Weight: 7 lb 12.3 oz (3525 g) APGAR: 8, 9  Newborn Delivery   Birth date/time:  08/04/2018 10:28:00 Delivery type:  VBAC, Spontaneous     Baby Feeding:  Breast Disposition:home with mother   08/05/2018 Logan Bores, MD

## 2018-08-15 ENCOUNTER — Inpatient Hospital Stay (HOSPITAL_COMMUNITY)
Admission: AD | Admit: 2018-08-15 | Payer: Medicaid Other | Source: Ambulatory Visit | Admitting: Obstetrics and Gynecology

## 2019-05-21 ENCOUNTER — Emergency Department (HOSPITAL_COMMUNITY)
Admission: EM | Admit: 2019-05-21 | Discharge: 2019-05-22 | Disposition: A | Discharge status: Home / Self Care | Payer: Medicaid Other | Attending: Emergency Medicine | Admitting: Emergency Medicine

## 2019-05-21 ENCOUNTER — Other Ambulatory Visit: Payer: Self-pay

## 2019-05-21 ENCOUNTER — Encounter (HOSPITAL_COMMUNITY): Payer: Self-pay | Admitting: Emergency Medicine

## 2019-05-21 ENCOUNTER — Emergency Department (HOSPITAL_COMMUNITY): Payer: Medicaid Other

## 2019-05-21 DIAGNOSIS — R0602 Shortness of breath: Secondary | ICD-10-CM | POA: Diagnosis not present

## 2019-05-21 DIAGNOSIS — Z79899 Other long term (current) drug therapy: Secondary | ICD-10-CM | POA: Diagnosis not present

## 2019-05-21 DIAGNOSIS — R609 Edema, unspecified: Secondary | ICD-10-CM | POA: Diagnosis not present

## 2019-05-21 MED ORDER — ALBUTEROL SULFATE HFA 108 (90 BASE) MCG/ACT IN AERS
2.0000 | INHALATION_SPRAY | RESPIRATORY_TRACT | Status: DC | PRN
  Filled 2019-05-21: qty 6.7

## 2019-05-21 NOTE — ED Triage Notes (Signed)
Patient here from home with complaints of SOB and bilateral ankle swelling, increased in right ankle x2 days. Reports that she has not been exposed to [place]. Denies fever.

## 2019-05-21 NOTE — ED Provider Notes (Signed)
[facility] DEPT Provider Note: Georgena Spurling, MD, FACEP  CSN: [id number] MRN: [id number] ARRIVAL: 05/21/19 at [place]: [place] of Breath and Leg Swelling   HISTORY OF PRESENT ILLNESS  05/21/19 11:36 PM Alyssa Hubbard is a 28 y.o. female who complains of shortness of breath for the past 2 weeks.  Shortness of breath is moderate at worst.  It is improved with leaning over or lying supine.  It is worse with exertion.  She denies chest pain.  She denies cough.  She denies wheezing.  She denies fever or chills.  For the past 2 days she has had some swelling in her lower legs, right greater than left.  She denies recent travel.  She denies COVID-19 exposure.  She is currently on her menses.   Past Medical History:  Diagnosis Date  . Anemia    states "supposed to be on Iron, but has not been taking it".  . Blood transfusion without reported diagnosis 2017   emergency c section placental abruption  . Fibroid    "I have had them for years".   Kennyth Arnold stones   . Pregnancy induced hypertension    with first preg  . VBAC (vaginal birth after Cesarean) 08/04/2018    Past Surgical History:  Procedure Laterality Date  . CESAREAN SECTION N/A 02/02/2016   Procedure: CESAREAN SECTION;  Surgeon: Sherlyn Hay, DO;  Location: [facility];  Service: Obstetrics;  Laterality: N/A;  . NO PAST SURGERIES      Family History  Problem Relation Age of Onset  . Diabetes Sister   . Asthma Sister   . Miscarriages / Stillbirths Sister   . Diabetes Maternal Grandmother   . Stroke Maternal Grandmother   . Arthritis Neg Hx   . Alcohol abuse Neg Hx   . Birth defects Neg Hx   . Cancer Neg Hx   . COPD Neg Hx   . Depression Neg Hx   . Drug abuse Neg Hx   . Early death Neg Hx   . Hearing loss Neg Hx   . Heart disease Neg Hx   . Hyperlipidemia Neg Hx   . Hypertension Neg Hx   . Kidney disease Neg Hx   . Learning disabilities Neg Hx   . Mental illness Neg Hx   .  Mental retardation Neg Hx   . Vision loss Neg Hx   . Varicose Veins Neg Hx     Social History   Tobacco Use  . Smoking status: Never Smoker  . Smokeless tobacco: Never Used  Substance Use Topics  . Alcohol use: No  . Drug use: No    Prior to Admission medications   Medication Sig Start Date End Date Taking? Authorizing Provider  cholecalciferol (VITAMIN D3) 25 MCG (1000 UT) tablet Take 1,000 Units by mouth daily.   Yes [provider]  ferrous sulfate 325 (65 FE) MG EC tablet Take 1 tablet (325 mg total) by mouth 2 (two) times daily. Patient taking differently: Take 325 mg by mouth daily.  02/23/15  Yes Newton Pigg, MD  acetaminophen (TYLENOL) 325 MG tablet Take 2 tablets (650 mg total) by mouth every 4 (four) hours as needed (for pain scale < 4). Patient not taking: Reported on 05/22/2019 08/05/18   Paula Compton, MD  ibuprofen (ADVIL,MOTRIN) 600 MG tablet Take 1 tablet (600 mg total) by mouth every 6 (six) hours. Patient not taking: Reported on 05/22/2019 08/05/18   Paula Compton, MD  Prenatal Vit-Fe Fumarate-FA (PRENATAL MULTIVITAMIN) TABS tablet Take 1 tablet by mouth daily at 12 noon. Patient not taking: Reported on 05/22/2019 02/23/15   Newton Pigg, MD    Allergies Patient has no known allergies.   REVIEW OF SYSTEMS  Negative except as noted here or in the History of Present Illness.   PHYSICAL EXAMINATION  Initial Vital Signs Blood pressure (!) 151/102, pulse 64, temperature 98.4 F (36.9 C), temperature source Oral, resp. rate 14, SpO2 100 %, unknown if currently breastfeeding.  Examination General: Well-developed, well-nourished female in no acute distress; appearance consistent with age of record HENT: normocephalic; atraumatic Eyes: pupils equal, round and reactive to light; extraocular muscles intact Neck: supple Heart: regular rate and rhythm Lungs: clear to auscultation bilaterally Abdomen: soft; nondistended; nontender; no masses or  hepatosplenomegaly; bowel sounds present Extremities: No deformity; full range of motion; pulses normal; trace edema of lower legs, right greater than left Neurologic: Awake, alert and oriented; motor function intact in all extremities and symmetric; no facial droop Skin: Warm and dry Psychiatric: Normal mood and affect   RESULTS  Summary of this visit's results, reviewed by myself:   EKG Interpretation  Date/Time:  Thursday May 21 2019 19:26:11 EDT Ventricular Rate:  68 PR Interval:    QRS Duration: 75 QT Interval:  370 QTC Calculation: 394 R Axis:   88 Text Interpretation:  Sinus rhythm Normal ECG No previous ECGs available Confirmed by Aradhya Shellenbarger, Jenny Reichmann [phone]) on 05/21/2019 11:36:59 PM      Laboratory Studies: Results for orders placed or performed during the hospital encounter of 05/21/19 (from the past 24 hour(s))  D-dimer, quantitative (not at [facility])     Status: Abnormal   Collection Time: 05/22/19 12:03 AM  Result Value Ref Range   D-Dimer, Quant 1.22 (H) 0.00 - 0.50 ug/mL-FEU  Brain natriuretic peptide     Status: Abnormal   Collection Time: 05/22/19 12:03 AM  Result Value Ref Range   B Natriuretic Peptide 125.0 (H) 0.0 - 100.0 pg/mL  CBC with Differential/Platelet     Status: Abnormal   Collection Time: 05/22/19 12:03 AM  Result Value Ref Range   WBC 5.3 4.0 - 10.5 K/uL   RBC 3.50 (L) 3.87 - 5.11 MIL/uL   Hemoglobin 9.2 (L) 12.0 - 15.0 g/dL   HCT 30.5 (L) 36.0 - 46.0 %   MCV 87.1 80.0 - 100.0 fL   MCH 26.3 26.0 - 34.0 pg   MCHC 30.2 30.0 - 36.0 g/dL   RDW 15.9 (H) 11.5 - 15.5 %   Platelets 161 150 - 400 K/uL   nRBC 0.0 0.0 - 0.2 %   Neutrophils Relative % 63 %   Neutro Abs 3.4 1.7 - 7.7 K/uL   Lymphocytes Relative 29 %   Lymphs Abs 1.6 0.7 - 4.0 K/uL   Monocytes Relative 6 %   Monocytes Absolute 0.3 0.1 - 1.0 K/uL   Eosinophils Relative 1 %   Eosinophils Absolute 0.1 0.0 - 0.5 K/uL   Basophils Relative 1 %   Basophils Absolute 0.0 0.0 - 0.1 K/uL   Immature  Granulocytes 0 %   Abs Immature Granulocytes 0.01 0.00 - 0.07 K/uL  Basic metabolic panel     Status: Abnormal   Collection Time: 05/22/19 12:03 AM  Result Value Ref Range   Sodium 137 135 - 145 mmol/L   Potassium 3.6 3.5 - 5.1 mmol/L   Chloride 106 98 - 111 mmol/L   CO2 22 22 - 32 mmol/L   Glucose,  Bld 94 70 - 99 mg/dL   BUN 11 6 - 20 mg/dL   Creatinine, Ser 0.71 0.44 - 1.00 mg/dL   Calcium 8.8 (L) 8.9 - 10.3 mg/dL   GFR calc non Af Amer >60 >60 mL/min   GFR calc Af Amer >60 >60 mL/min   Anion gap 9 5 - 15  I-Stat beta hCG blood, ED     Status: None   Collection Time: 05/22/19 12:08 AM  Result Value Ref Range   I-stat hCG, quantitative <5.0 <5 mIU/mL   Comment 3           Imaging Studies: Dg Chest 2 View  Result Date: 05/21/2019 CLINICAL DATA:  Shortness of breath for the past 2 weeks. EXAM: CHEST - 2 VIEW COMPARISON:  Chest x-ray dated September 08, 2016. FINDINGS: The heart size and mediastinal contours are within normal limits. Both lungs are clear. The visualized skeletal structures are unremarkable. IMPRESSION: No active cardiopulmonary disease. Electronically Signed   By: Titus Dubin M.D.   On: 05/21/2019 19:55   Ct Angio Chest Pe W And/or Wo Contrast  Result Date: 05/22/2019 CLINICAL DATA:  28 year old female with shortness of breath. EXAM: CT ANGIOGRAPHY CHEST WITH CONTRAST TECHNIQUE: Multidetector CT imaging of the chest was performed using the standard protocol during bolus administration of intravenous contrast. Multiplanar CT image reconstructions and MIPs were obtained to evaluate the vascular anatomy. CONTRAST:  131mL OMNIPAQUE IOHEXOL 350 MG/ML SOLN COMPARISON:  Chest radiograph dated 05/21/2019 FINDINGS: Cardiovascular: Top-normal cardiac size. No pericardial effusion. Evaluation of the pulmonary arteries is limited due to respiratory motion artifact and suboptimal opacification and timing of the contrast. No large or central pulmonary artery embolus identified.  Mediastinum/Nodes: No hilar or mediastinal adenopathy. Esophagus and the thyroid gland are grossly unremarkable. No mediastinal fluid collection. Lungs/Pleura: Lungs are clear. No pleural effusion or pneumothorax. Upper Abdomen: No acute abnormality. Musculoskeletal: No chest wall abnormality. No acute or significant osseous findings. Review of the MIP images confirms the above findings. IMPRESSION: No acute intrathoracic pathology. No large or central pulmonary artery embolus identified. Electronically Signed   By: Anner Crete M.D.   On: 05/22/2019 02:33    ED COURSE and MDM  Nursing notes and initial vitals signs, including pulse oximetry, reviewed.  Vitals:   05/21/19 1926 05/21/19 2347 05/22/19 0202  BP: (!) 151/102 (!) 144/100 131/90  Pulse: 64 (!) 57 77  Resp: 14 16 16   Temp: 98.4 F (36.9 C)    TempSrc: Oral    SpO2: 100% 100% 100%   The cause for the patient's dyspnea is unclear.  She refused to use an albuterol inhaler because she is lactating.   PROCEDURES    ED DIAGNOSES     ICD-10-CM   1. Shortness of breath  R06.02   2. Peripheral edema  R60.9        Camry Robello, Jenny Reichmann, MD 05/22/19 [phone]

## 2019-05-22 ENCOUNTER — Emergency Department (HOSPITAL_COMMUNITY): Payer: Medicaid Other

## 2019-05-22 LAB — CBC WITH DIFFERENTIAL/PLATELET
Abs Immature Granulocytes: 0.01 10*3/uL (ref 0.00–0.07)
Basophils Absolute: 0 10*3/uL (ref 0.0–0.1)
Basophils Relative: 1 %
Eosinophils Absolute: 0.1 10*3/uL (ref 0.0–0.5)
Eosinophils Relative: 1 %
HCT: 30.5 % — ABNORMAL LOW (ref 36.0–46.0)
Hemoglobin: 9.2 g/dL — ABNORMAL LOW (ref 12.0–15.0)
Immature Granulocytes: 0 %
Lymphocytes Relative: 29 %
Lymphs Abs: 1.6 10*3/uL (ref 0.7–4.0)
MCH: 26.3 pg (ref 26.0–34.0)
MCHC: 30.2 g/dL (ref 30.0–36.0)
MCV: 87.1 fL (ref 80.0–100.0)
Monocytes Absolute: 0.3 10*3/uL (ref 0.1–1.0)
Monocytes Relative: 6 %
Neutro Abs: 3.4 10*3/uL (ref 1.7–7.7)
Neutrophils Relative %: 63 %
Platelets: 161 10*3/uL (ref 150–400)
RBC: 3.5 MIL/uL — ABNORMAL LOW (ref 3.87–5.11)
RDW: 15.9 % — ABNORMAL HIGH (ref 11.5–15.5)
WBC: 5.3 10*3/uL (ref 4.0–10.5)
nRBC: 0 % (ref 0.0–0.2)

## 2019-05-22 LAB — BRAIN NATRIURETIC PEPTIDE: B Natriuretic Peptide: 125 pg/mL — ABNORMAL HIGH (ref 0.0–100.0)

## 2019-05-22 LAB — BASIC METABOLIC PANEL
Anion gap: 9 (ref 5–15)
BUN: 11 mg/dL (ref 6–20)
CO2: 22 mmol/L (ref 22–32)
Calcium: 8.8 mg/dL — ABNORMAL LOW (ref 8.9–10.3)
Chloride: 106 mmol/L (ref 98–111)
Creatinine, Ser: 0.71 mg/dL (ref 0.44–1.00)
GFR calc Af Amer: >60 mL/min (ref >60)
GFR calc non Af Amer: >60 mL/min (ref >60)
Glucose, Bld: 94 mg/dL (ref 70–99)
Potassium: 3.6 mmol/L (ref 3.5–5.1)
Sodium: 137 mmol/L (ref 135–145)

## 2019-05-22 LAB — I-STAT BETA HCG BLOOD, ED (NOT ORDERABLE): I-stat hCG, quantitative: <5 m[IU]/mL (ref <5)

## 2019-05-22 LAB — D-DIMER, QUANTITATIVE (NOT AT ARMC): D-Dimer, Quant: 1.22 ug/mL-FEU — ABNORMAL HIGH (ref 0.00–0.50)

## 2019-05-22 MED ORDER — IOHEXOL 350 MG/ML SOLN
100.0000 mL | Freq: Once | INTRAVENOUS | Status: AC | PRN
  Administered 2019-05-22: 100 mL via INTRAVENOUS

## 2019-05-22 MED ORDER — SODIUM CHLORIDE (PF) 0.9 % IJ SOLN
INTRAMUSCULAR | Status: AC
  Filled 2019-05-22: qty 50

## 2019-05-22 NOTE — ED Notes (Addendum)
Pt.documented in error CT Angio Chest PE W and or w/o Contrast.

## 2019-08-17 ENCOUNTER — Telehealth: Payer: Self-pay | Admitting: Hematology

## 2019-08-17 NOTE — Telephone Encounter (Signed)
Received a new hem referral from Dr. Terri Piedra for anemia. Alyssa Hubbard returned my call and has been scheduled to see Alyssa Hubbard on 10/12 at 10am. She's been made aware to arrive 15 minutes early.

## 2019-09-06 NOTE — Progress Notes (Signed)
HEMATOLOGY/ONCOLOGY CONSULTATION NOTE  Date of Service: 09/06/2019  Patient Care Team: Harlan Stains, MD as PCP - General (Family Medicine)  CHIEF COMPLAINTS/PURPOSE OF CONSULTATION:  Anemia  HISTORY OF PRESENTING ILLNESS:   Alyssa Hubbard is a wonderful 28 y.o. female who has been referred to [place] by Dr Carlynn Purl for evaluation and management of anemia. The pt reports that she is doing well overall.   The pt reports that she began to feel SOB in the beginning of this year and is more cold during her period. In 2017 she did have a blood transfusion after blood loss during the birth of her twins. Pt has 5 children and gave birth to her youngest on 08/04/2018. She is currently breastfeeding. Her periods have always been heavy and have been attributed to her intrauterine fibroids. Her fibroids have not gotten larger since 2014 but she is still experiencing menorrhagia. Her OBGYN told her that the lining of her uterus is thick and discussed some options to minimize blood loss during periods. They discussed D&C, hysterectomy, Megace and an IUD. Pt states that her OBGYN did not recommend her to get a IUD and she is not interested in birth control. Pt was told to take PO Ferrous Sulfate in 2014 and notes that she has problems taking it consistently. It causes some constipation when she takes it regularly. Pt reports some fatigue and an ice craving, eating about 2-3 cups of ice per day. Pt is watching her carbohydrate intake but is not gluten intolerant. She has recently lost a bit of weight but did so intentionally. She notes that when she saw her doctor last there was some leg swelling that went away on its own.   Pt has a history of gallstones and some of her sisters also have anemia. There is no family history of blood conditions or cancers. Pt denies smoking or drinking frequently.   Most recent lab results (07/31/2019) of CBC is as follows: WBC at 6.0K, RBC at 4.28, Hgb at 10.7, HCT at  34.7, MCV at 81, MCH at 25.0, MCHC at 30.8, RDW at 14.7, PLTs at 221K, Neutro Rel at 71, Lymphs Rel at 23, Monos Rel at 5, Eos Rel at 1, Basos Rel at 0, Neutro Abs at 4.2K, Lymphs Abs at 1.4K, Monos Abs at 0.3K, Eos Abs at 0.1K, Basos Abs at 0.0K, Immanture Granulocytes Rel at 0, Abs Immature Granulocytes at 0.0K.  07/31/2019 Iron and TIBC is as follows: Iron at 386, UIBC at 359, Iron at 27, Iron Sat at 7 05/22/2019 I-Stat beta hCG blood at <5.0  On review of systems, pt reports SOB, fatigue, cold sensation and denies unexpected weight loss, abdominal pain and any other symptoms.   On PMHx the pt reports gallstones, uterine fibroids, blood transfusion On Social Hx the pt reports that she is a non-smoker and does not drink On Family Hx the pt reports sisters with anemia  MEDICAL HISTORY:  Past Medical History:  Diagnosis Date  . Anemia    states "supposed to be on Iron, but has not been taking it".  . Blood transfusion without reported diagnosis 2017   emergency c section placental abruption  . Fibroid    "I have had them for years".   Kennyth Arnold stones   . Pregnancy induced hypertension    with first preg  . VBAC (vaginal birth after Cesarean) 08/04/2018    SURGICAL HISTORY: Past Surgical History:  Procedure Laterality Date  . CESAREAN SECTION N/A 02/02/2016  Procedure: CESAREAN SECTION;  Surgeon: Sherlyn Hay, DO;  Location: [facility];  Service: Obstetrics;  Laterality: N/A;  . NO PAST SURGERIES      SOCIAL HISTORY: Social History   Socioeconomic History  . Marital status: Married    Spouse name: Gwyndolyn Saxon  . Number of children: Not on file  . Years of education: Not on file  . Highest education level: Not on file  Occupational History  . Not on file  Social Needs  . Financial resource strain: Not hard at all  . Food insecurity    Worry: Patient refused    Inability: Patient refused  . Transportation needs    Medical: Patient refused    Non-medical: Patient refused   Tobacco Use  . Smoking status: Never Smoker  . Smokeless tobacco: Never Used  Substance and Sexual Activity  . Alcohol use: No  . Drug use: No  . Sexual activity: Yes    Birth control/protection: None  Lifestyle  . Physical activity    Days per week: Patient refused    Minutes per session: Patient refused  . Stress: Not at all  Relationships  . Social Herbalist on phone: Patient refused    Gets together: Patient refused    Attends religious service: Patient refused    Active member of club or organization: Patient refused    Attends meetings of clubs or organizations: Patient refused    Relationship status: Patient refused  . Intimate partner violence    Fear of current or ex partner: Patient refused    Emotionally abused: Patient refused    Physically abused: Patient refused    Forced sexual activity: Patient refused  Other Topics Concern  . Not on file  Social History Narrative  . Not on file    FAMILY HISTORY: Family History  Problem Relation Age of Onset  . Diabetes Sister   . Asthma Sister   . Miscarriages / Stillbirths Sister   . Diabetes Maternal Grandmother   . Stroke Maternal Grandmother   . Arthritis Neg Hx   . Alcohol abuse Neg Hx   . Birth defects Neg Hx   . Cancer Neg Hx   . COPD Neg Hx   . Depression Neg Hx   . Drug abuse Neg Hx   . Early death Neg Hx   . Hearing loss Neg Hx   . Heart disease Neg Hx   . Hyperlipidemia Neg Hx   . Hypertension Neg Hx   . Kidney disease Neg Hx   . Learning disabilities Neg Hx   . Mental illness Neg Hx   . Mental retardation Neg Hx   . Vision loss Neg Hx   . Varicose Veins Neg Hx     ALLERGIES:  has No Known Allergies.  MEDICATIONS:  Current Outpatient Medications  Medication Sig Dispense Refill  . cholecalciferol (VITAMIN D3) 25 MCG (1000 UT) tablet Take 1,000 Units by mouth daily.    . ferrous sulfate 325 (65 FE) MG EC tablet Take 1 tablet (325 mg total) by mouth 2 (two) times daily. (Patient  taking differently: Take 325 mg by mouth daily. ) 60 tablet 3   No current facility-administered medications for this visit.     REVIEW OF SYSTEMS:    10 Point review of Systems was done is negative except as noted above.  PHYSICAL EXAMINATION: ECOG PERFORMANCE STATUS: 1 - Symptomatic but completely ambulatory  . Vitals:   09/07/19 1027  BP: 122/70  Pulse:  81  Resp: 18  Temp: 98.5 F (36.9 C)  SpO2: 100%   Filed Weights   09/07/19 1027  Weight: 197 lb 9.6 oz (89.6 kg)   .Body mass index is 30.04 kg/m.  GENERAL:alert, in no acute distress and comfortable SKIN: no acute rashes, no significant lesions EYES: conjunctiva are pink and non-injected, sclera anicteric OROPHARYNX: MMM, no exudates, no oropharyngeal erythema or ulceration NECK: supple, no JVD LYMPH:  no palpable lymphadenopathy in the cervical, axillary or inguinal regions LUNGS: clear to auscultation b/l with normal respiratory effort HEART: regular rate & rhythm ABDOMEN:  normoactive bowel sounds , non tender, not distended. Extremity: no pedal edema PSYCH: alert & oriented x 3 with fluent speech NEURO: no focal motor/sensory deficits  LABORATORY DATA:  I have reviewed the data as listed  . CBC Latest Ref Rng & Units 09/07/2019 05/22/2019 08/05/2018  WBC 4.0 - 10.5 K/uL 5.4 5.3 7.3  Hemoglobin 12.0 - 15.0 g/dL 10.2(L) 9.2(L) 7.8(L)  Hematocrit 36.0 - 46.0 % 33.8(L) 30.5(L) 25.3(L)  Platelets 150 - 400 K/uL 229 161 127(L)    . CMP Latest Ref Rng & Units 09/07/2019 05/22/2019 08/04/2018  Glucose 70 - 99 mg/dL 91 94 86  BUN 6 - 20 mg/dL 10 11 9   Creatinine 0.44 - 1.00 mg/dL 0.72 0.71 0.31(L)  Sodium 135 - 145 mmol/L 139 137 133(L)  Potassium 3.5 - 5.1 mmol/L 4.4 3.6 3.9  Chloride 98 - 111 mmol/L 105 106 105  CO2 22 - 32 mmol/L 26 22 18(L)  Calcium 8.9 - 10.3 mg/dL 9.6 8.8(L) 8.7(L)  Total Protein 6.5 - 8.1 g/dL 8.2(H) - 6.8  Total Bilirubin 0.3 - 1.2 mg/dL 0.5 - 0.4  Alkaline Phos 38 - 126 U/L 49 - 73   AST 15 - 41 U/L 12(L) - 13(L)  ALT 0 - 44 U/L 8 - 9   . Lab Results  Component Value Date   IRON 30 (L) 09/07/2019   TIBC 415 09/07/2019   IRONPCTSAT 7 (L) 09/07/2019   (Iron and TIBC)  Lab Results  Component Value Date   FERRITIN <4 (L) 09/07/2019     RADIOGRAPHIC STUDIES: I have personally reviewed the radiological images as listed and agreed with the findings in the report. No results found.  ASSESSMENT & PLAN:   1) Iron deficiency Anemia Likely related to menorrhagia PLAN: -Discussed patient's most recent labs from 07/31/2019, WBC at 6.0K, RBC at 4.28, Hgb at 10.7, HCT at 34.7, MCV at 81, MCH at 25.0, MCHC at 30.8, RDW at 14.7, PLTs at 221K, Neutro Rel at 71, Lymphs Rel at 23, Monos Rel at 5, Eos Rel at 1, Basos Rel at 0, Neutro Abs at 4.2K, Lymphs Abs at 1.4K, Monos Abs at 0.3K, Eos Abs at 0.1K, Basos Abs at 0.0K, Immanture Granulocytes Rel at 0, Abs Immature Granulocytes at 0.0K.  -Discussed 07/31/2019 Iron and TIBC is as follows: Iron at 386, UIBC at 359, Iron at 27, Iron Sat at , ferritin<4. -Discussed options for iron replacement  -Pt would like to take IV Iron  -Will give pt IV Injectafer weekly 2x  -Will get labs today -Will see back in 2 months with labs    FOLLOW UP: Labs today IV Injectafer weekly x 2 doses starting in 5-7 days RTC with Dr Irene Limbo with labs in 2 months  All of the patients questions were answered with apparent satisfaction. The patient knows to call the clinic with any problems, questions or concerns.  I spent 25 mins  counseling the patient face to face. The total time spent in the appointment was 35 minutes and more than 50% was on counseling and direct patient cares.    Sullivan Lone MD [place] AAHIVMS [facility] Hematology/Oncology Physician [facility]  (Office):       [phone] (Work cell):  [phone] (Fax):           [phone]  09/06/2019 12:13 PM  I, Yevette Edwards, am acting as a scribe for Dr. Sullivan Lone.    .I have reviewed the above documentation for accuracy and completeness, and I agree with the above. Brunetta Genera MD

## 2019-09-07 ENCOUNTER — Inpatient Hospital Stay: Payer: Medicaid Other

## 2019-09-07 ENCOUNTER — Inpatient Hospital Stay: Payer: Medicaid Other | Attending: Hematology | Admitting: Hematology

## 2019-09-07 ENCOUNTER — Other Ambulatory Visit: Payer: Self-pay

## 2019-09-07 ENCOUNTER — Telehealth: Payer: Self-pay | Admitting: Hematology

## 2019-09-07 VITALS — BP 122/70 | HR 81 | Temp 98.5°F | Resp 18 | Ht 68.0 in | Wt 197.6 lb

## 2019-09-07 DIAGNOSIS — D500 Iron deficiency anemia secondary to blood loss (chronic): Secondary | ICD-10-CM | POA: Insufficient documentation

## 2019-09-07 DIAGNOSIS — Z79899 Other long term (current) drug therapy: Secondary | ICD-10-CM | POA: Diagnosis not present

## 2019-09-07 DIAGNOSIS — D259 Leiomyoma of uterus, unspecified: Secondary | ICD-10-CM | POA: Diagnosis not present

## 2019-09-07 DIAGNOSIS — N920 Excessive and frequent menstruation with regular cycle: Secondary | ICD-10-CM | POA: Insufficient documentation

## 2019-09-07 LAB — CBC WITH DIFFERENTIAL/PLATELET
Abs Immature Granulocytes: 0.01 10*3/uL (ref 0.00–0.07)
Basophils Absolute: 0 10*3/uL (ref 0.0–0.1)
Basophils Relative: 1 %
Eosinophils Absolute: 0 10*3/uL (ref 0.0–0.5)
Eosinophils Relative: 1 %
HCT: 33.8 % — ABNORMAL LOW (ref 36.0–46.0)
Hemoglobin: 10.2 g/dL — ABNORMAL LOW (ref 12.0–15.0)
Immature Granulocytes: 0 %
Lymphocytes Relative: 28 %
Lymphs Abs: 1.5 10*3/uL (ref 0.7–4.0)
MCH: 24.8 pg — ABNORMAL LOW (ref 26.0–34.0)
MCHC: 30.2 g/dL (ref 30.0–36.0)
MCV: 82.2 fL (ref 80.0–100.0)
Monocytes Absolute: 0.3 10*3/uL (ref 0.1–1.0)
Monocytes Relative: 5 %
Neutro Abs: 3.6 10*3/uL (ref 1.7–7.7)
Neutrophils Relative %: 65 %
Platelets: 229 10*3/uL (ref 150–400)
RBC: 4.11 MIL/uL (ref 3.87–5.11)
RDW: 16.9 % — ABNORMAL HIGH (ref 11.5–15.5)
WBC: 5.4 10*3/uL (ref 4.0–10.5)
nRBC: 0 % (ref 0.0–0.2)

## 2019-09-07 LAB — VITAMIN B12: Vitamin B-12: 336 pg/mL (ref 180–914)

## 2019-09-07 LAB — CMP (CANCER CENTER ONLY)
ALT: 8 U/L (ref 0–44)
AST: 12 U/L — ABNORMAL LOW (ref 15–41)
Albumin: 4.2 g/dL (ref 3.5–5.0)
Alkaline Phosphatase: 49 U/L (ref 38–126)
Anion gap: 8 (ref 5–15)
BUN: 10 mg/dL (ref 6–20)
CO2: 26 mmol/L (ref 22–32)
Calcium: 9.6 mg/dL (ref 8.9–10.3)
Chloride: 105 mmol/L (ref 98–111)
Creatinine: 0.72 mg/dL (ref 0.44–1.00)
GFR, Est AFR Am: >60 mL/min (ref >60)
GFR, Estimated: >60 mL/min (ref >60)
Glucose, Bld: 91 mg/dL (ref 70–99)
Potassium: 4.4 mmol/L (ref 3.5–5.1)
Sodium: 139 mmol/L (ref 135–145)
Total Bilirubin: 0.5 mg/dL (ref 0.3–1.2)
Total Protein: 8.2 g/dL — ABNORMAL HIGH (ref 6.5–8.1)

## 2019-09-07 LAB — FERRITIN: Ferritin: <4 ng/mL — ABNORMAL LOW (ref 11–307)

## 2019-09-07 LAB — IRON AND TIBC
Iron: 30 ug/dL — ABNORMAL LOW (ref 41–142)
Saturation Ratios: 7 % — ABNORMAL LOW (ref 21–57)
TIBC: 415 ug/dL (ref 236–444)
UIBC: 385 ug/dL — ABNORMAL HIGH (ref 120–384)

## 2019-09-07 NOTE — Telephone Encounter (Signed)
Scheduled appt per 10/12 los - gave patient AVS and calender per los  

## 2019-09-14 ENCOUNTER — Other Ambulatory Visit: Payer: Self-pay

## 2019-09-14 ENCOUNTER — Inpatient Hospital Stay: Payer: Medicaid Other

## 2019-09-14 VITALS — BP 113/67 | HR 69 | Temp 98.7°F | Resp 18

## 2019-09-14 DIAGNOSIS — D500 Iron deficiency anemia secondary to blood loss (chronic): Secondary | ICD-10-CM

## 2019-09-14 MED ORDER — ACETAMINOPHEN 325 MG PO TABS
ORAL_TABLET | ORAL | Status: AC
  Filled 2019-09-14: qty 2

## 2019-09-14 MED ORDER — LORATADINE 10 MG PO TABS
10.0000 mg | ORAL_TABLET | Freq: Once | ORAL | Status: AC
  Administered 2019-09-14: 09:00:00 10 mg via ORAL

## 2019-09-14 MED ORDER — SODIUM CHLORIDE 0.9 % IV SOLN
Freq: Once | INTRAVENOUS | Status: AC
  Administered 2019-09-14: 09:00:00 via INTRAVENOUS
  Filled 2019-09-14: qty 250

## 2019-09-14 MED ORDER — SODIUM CHLORIDE 0.9 % IV SOLN
750.0000 mg | Freq: Once | INTRAVENOUS | Status: AC
  Administered 2019-09-14: 10:00:00 750 mg via INTRAVENOUS
  Filled 2019-09-14: qty 15

## 2019-09-14 MED ORDER — LORATADINE 10 MG PO TABS
ORAL_TABLET | ORAL | Status: AC
  Filled 2019-09-14: qty 1

## 2019-09-14 MED ORDER — ACETAMINOPHEN 325 MG PO TABS
650.0000 mg | ORAL_TABLET | Freq: Once | ORAL | Status: AC
  Administered 2019-09-14: 09:00:00 650 mg via ORAL

## 2019-09-14 MED ORDER — SODIUM CHLORIDE 0.9 % IV SOLN
INTRAVENOUS | Status: DC
  Administered 2019-09-14: 10:00:00 via INTRAVENOUS
  Filled 2019-09-14: qty 250

## 2019-09-14 NOTE — Patient Instructions (Signed)

## 2019-09-16 ENCOUNTER — Telehealth: Payer: Self-pay | Admitting: Hematology

## 2019-09-16 NOTE — Telephone Encounter (Signed)
Returned patient's phone call regarding rescheduling 10/26 appointment, left a voicemail.

## 2019-09-17 ENCOUNTER — Telehealth: Payer: Self-pay | Admitting: Hematology

## 2019-09-17 NOTE — Telephone Encounter (Signed)
Returned patient's phone call regarding rescheduling an appointment, left a voicemail. 

## 2019-09-21 ENCOUNTER — Inpatient Hospital Stay: Payer: Medicaid Other

## 2019-09-23 ENCOUNTER — Telehealth: Payer: Self-pay | Admitting: Hematology

## 2019-09-23 NOTE — Telephone Encounter (Signed)
R/s apt per 10/28 sch message pt is aware of appt date and time

## 2019-09-28 ENCOUNTER — Inpatient Hospital Stay: Payer: Medicaid Other | Attending: Hematology

## 2019-09-28 ENCOUNTER — Other Ambulatory Visit: Payer: Self-pay

## 2019-09-28 VITALS — BP 131/86 | HR 64 | Temp 98.5°F | Resp 17

## 2019-09-28 DIAGNOSIS — N920 Excessive and frequent menstruation with regular cycle: Secondary | ICD-10-CM | POA: Insufficient documentation

## 2019-09-28 DIAGNOSIS — D500 Iron deficiency anemia secondary to blood loss (chronic): Secondary | ICD-10-CM | POA: Diagnosis not present

## 2019-09-28 MED ORDER — LORATADINE 10 MG PO TABS
ORAL_TABLET | ORAL | Status: AC
  Filled 2019-09-28: qty 1

## 2019-09-28 MED ORDER — LORATADINE 10 MG PO TABS
10.0000 mg | ORAL_TABLET | Freq: Once | ORAL | Status: AC
  Administered 2019-09-28: 10 mg via ORAL

## 2019-09-28 MED ORDER — ACETAMINOPHEN 325 MG PO TABS
ORAL_TABLET | ORAL | Status: AC
  Filled 2019-09-28: qty 2

## 2019-09-28 MED ORDER — ACETAMINOPHEN 325 MG PO TABS
650.0000 mg | ORAL_TABLET | Freq: Once | ORAL | Status: AC
  Administered 2019-09-28: 650 mg via ORAL

## 2019-09-28 MED ORDER — SODIUM CHLORIDE 0.9 % IV SOLN
750.0000 mg | Freq: Once | INTRAVENOUS | Status: AC
  Administered 2019-09-28: 750 mg via INTRAVENOUS
  Filled 2019-09-28: qty 15

## 2019-09-28 MED ORDER — SODIUM CHLORIDE 0.9 % IV SOLN
Freq: Once | INTRAVENOUS | Status: AC
  Administered 2019-09-28: 09:00:00 via INTRAVENOUS
  Filled 2019-09-28: qty 250

## 2019-09-28 NOTE — Patient Instructions (Signed)
Ferric carboxymaltose injection What is this medicine? FERRIC CARBOXYMALTOSE (ferr-ik car-box-ee-mol-toes) is an iron complex. Iron is used to make healthy red blood cells, which carry oxygen and nutrients throughout the body. This medicine is used to treat anemia in people with chronic kidney disease or people who cannot take iron by mouth. This medicine may be used for other purposes; ask your health care provider or pharmacist if you have questions. COMMON BRAND NAME(S): Injectafer What should I tell my health care provider before I take this medicine? They need to know if you have any of these conditions:  high levels of iron in the blood  liver disease  an unusual or allergic reaction to iron, other medicines, foods, dyes, or preservatives  pregnant or trying to get pregnant  breast-feeding How should I use this medicine? This medicine is for infusion into a vein. It is given by a health care professional in a hospital or clinic setting. Talk to your pediatrician regarding the use of this medicine in children. Special care may be needed. Overdosage: If you think you have taken too much of this medicine contact a poison control center or emergency room at once. NOTE: This medicine is only for you. Do not share this medicine with others. What if I miss a dose? It is important not to miss your dose. Call your doctor or health care professional if you are unable to keep an appointment. What may interact with this medicine? Do not take this medicine with any of the following medications:  deferoxamine  dimercaprol  other iron products This list may not describe all possible interactions. Give your health care provider a list of all the medicines, herbs, non-prescription drugs, or dietary supplements you use. Also tell them if you smoke, drink alcohol, or use illegal drugs. Some items may interact with your medicine. What should I watch for while using this medicine? Visit your  doctor or health care professional regularly. Tell your doctor if your symptoms do not start to get better or if they get worse. You may need blood work done while you are taking this medicine. You may need to follow a special diet. Talk to your doctor. Foods that contain iron include: whole grains/cereals, dried fruits, beans, or peas, leafy green vegetables, and organ meats (liver, kidney). What side effects may I notice from receiving this medicine? Side effects that you should report to your doctor or health care professional as soon as possible:  allergic reactions like skin rash, itching or hives, swelling of the face, lips, or tongue  dizziness  facial flushing Side effects that usually do not require medical attention (report to your doctor or health care professional if they continue or are bothersome):  changes in taste  constipation  headache  nausea, vomiting  pain, redness, or irritation at site where injected This list may not describe all possible side effects. Call your doctor for medical advice about side effects. You may report side effects to FDA at 1-800-FDA-1088. Where should I keep my medicine? This drug is given in a hospital or clinic and will not be stored at home. NOTE: This sheet is a summary. It may not cover all possible information. If you have questions about this medicine, talk to your doctor, pharmacist, or health care provider.  2020 Elsevier/Gold Standard (2016-12-27 09:40:29)  Coronavirus (COVID-19) Are you at risk?  Are you at risk for the Coronavirus (COVID-19)?  To be considered HIGH RISK for Coronavirus (COVID-19), you have to meet the following   criteria:  . Traveled to [place], [place], [place], [place] or [place]; or in the [place] to [place], [place], [place], or [place]; and have fever, cough, and shortness of breath within the last 2 weeks of travel OR . Been in close contact with a person diagnosed with COVID-19 within the  last 2 weeks and have fever, cough, and shortness of breath . IF YOU DO NOT MEET THESE CRITERIA, YOU ARE CONSIDERED LOW RISK FOR COVID-19.  What to do if you are HIGH RISK for COVID-19?  . If you are having a medical emergency, call 911. . Seek medical care right away. Before you go to a doctor's office, urgent care or emergency department, call ahead and tell them about your recent travel, contact with someone diagnosed with COVID-19, and your symptoms. You should receive instructions from your physician's office regarding next steps of care.  . When you arrive at healthcare provider, tell the healthcare staff immediately you have returned from visiting [place], [place], [place], [place] or [place]; or traveled in the [place] to [place], [place], [place], or [place]; in the last two weeks or you have been in close contact with a person diagnosed with COVID-19 in the last 2 weeks.   . Tell the health care staff about your symptoms: fever, cough and shortness of breath. . After you have been seen by a medical provider, you will be either: o Tested for (COVID-19) and discharged home on quarantine except to seek medical care if symptoms worsen, and asked to  - Stay home and avoid contact with others until you get your results (4-5 days)  - Avoid travel on public transportation if possible (such as bus, train, or airplane) or o Sent to the Emergency Department by EMS for evaluation, COVID-19 testing, and possible admission depending on your condition and test results.  What to do if you are LOW RISK for COVID-19?  Reduce your risk of any infection by using the same precautions used for avoiding the common cold or flu:  . Wash your hands often with soap and warm water for at least 20 seconds.  If soap and water are not readily available, use an alcohol-based hand sanitizer with at least 60% alcohol.  . If coughing or sneezing, cover your mouth and nose by coughing or sneezing into the elbow  areas of your shirt or coat, into a tissue or into your sleeve (not your hands). . Avoid shaking hands with others and consider head nods or verbal greetings only. . Avoid touching your eyes, nose, or mouth with unwashed hands.  . Avoid close contact with people who are sick. . Avoid places or events with large numbers of people in one location, like concerts or sporting events. . Carefully consider travel plans you have or are making. . If you are planning any travel outside or inside the [place], visit the CDC's Travelers' Health webpage for the latest health notices. . If you have some symptoms but not all symptoms, continue to monitor at home and seek medical attention if your symptoms worsen. . If you are having a medical emergency, call 911.   ADDITIONAL HEALTHCARE OPTIONS FOR PATIENTS  [facility] / e-Visit: [email or web address]         [facility]: [phone]  [facility]: [phone]                   [facility]: [phone]   

## 2019-11-09 ENCOUNTER — Inpatient Hospital Stay: Payer: Medicaid Other

## 2019-11-09 ENCOUNTER — Inpatient Hospital Stay: Payer: Medicaid Other | Attending: Hematology | Admitting: Hematology

## 2019-11-11 ENCOUNTER — Telehealth: Payer: Self-pay | Admitting: Hematology

## 2019-11-11 NOTE — Telephone Encounter (Signed)
Called pt per 12/14 sch message - no answer  . Left message for patient to call back to reschedule missed appt .

## 2021-05-08 ENCOUNTER — Encounter: Payer: Self-pay | Admitting: Hematology

## 2021-05-15 ENCOUNTER — Ambulatory Visit (INDEPENDENT_AMBULATORY_CARE_PROVIDER_SITE_OTHER): Payer: Medicaid Other | Admitting: Orthopaedic Surgery

## 2021-05-15 ENCOUNTER — Ambulatory Visit: Payer: Self-pay

## 2021-05-15 ENCOUNTER — Other Ambulatory Visit: Payer: Self-pay

## 2021-05-15 ENCOUNTER — Ambulatory Visit (INDEPENDENT_AMBULATORY_CARE_PROVIDER_SITE_OTHER): Payer: Medicaid Other

## 2021-05-15 VITALS — Ht 68.0 in

## 2021-05-15 DIAGNOSIS — M25562 Pain in left knee: Secondary | ICD-10-CM | POA: Diagnosis not present

## 2021-05-15 DIAGNOSIS — M25561 Pain in right knee: Secondary | ICD-10-CM | POA: Diagnosis not present

## 2021-05-15 DIAGNOSIS — G8929 Other chronic pain: Secondary | ICD-10-CM

## 2021-05-15 DIAGNOSIS — Z96652 Presence of left artificial knee joint: Secondary | ICD-10-CM

## 2021-05-15 MED ORDER — LIDOCAINE HCL 1 % IJ SOLN
3.0000 mL | INTRAMUSCULAR | Status: AC | PRN
  Administered 2021-05-15: 3 mL

## 2021-05-15 MED ORDER — METHYLPREDNISOLONE ACETATE 40 MG/ML IJ SUSP
40.0000 mg | INTRAMUSCULAR | Status: AC | PRN
  Administered 2021-05-15: 40 mg via INTRA_ARTICULAR

## 2021-05-15 NOTE — Progress Notes (Signed)
Office Visit Note   Patient: Alyssa Hubbard           Date of Birth: [birth year]           MRN: [id number] Visit Date: 05/15/2021              Requested by: Harlan Stains, MD [place],   [id number] PCP: Harlan Stains, MD   Assessment & Plan: Visit Diagnoses:  1. Chronic pain of left knee   2. Chronic pain of right knee     Plan: We talked about her knees in detail and I went over her x-rays.  I did recommend a steroid injection in both knees today and she agreed to this and tolerated well.  I would like to send her to outpatient physical therapy to work on various modalities to increase the strength of both knees.  She may be a candidate at some point for hyaluronic acid for at least the right knee given the osteoarthritic findings.  If she continues to have any locking catching with either knee then a MRI would be warranted.  I would like to see her back in 4 weeks to see how she is doing overall.  All questions and concerns were answered and addressed.  I would like a new weight and BMI calculation also at that visit.  Follow-Up Instructions: Return in about 4 weeks (around 06/12/2021).   Orders:  Orders Placed This Encounter  Procedures   Large Joint Inj   Large Joint Inj   XR Knee 1-2 Views Left   XR Knee 1-2 Views Right   No orders of the defined types were placed in this encounter.     Procedures: Large Joint Inj: R knee on 05/15/2021 11:51 AM Indications: diagnostic evaluation and pain Details: 22 G 1.5 in needle, superolateral approach  Arthrogram: No  Medications: 3 mL lidocaine 1 %; 40 mg methylPREDNISolone acetate 40 MG/ML Outcome: tolerated well, no immediate complications Procedure, treatment alternatives, risks and benefits explained, specific risks discussed. Consent was given by the patient. Immediately prior to procedure a time out was called to verify the correct patient, procedure, equipment, support staff and site/side  marked as required. Patient was prepped and draped in the usual sterile fashion.    Large Joint Inj: L knee on 05/15/2021 11:51 AM Indications: diagnostic evaluation and pain Details: 22 G 1.5 in needle, superolateral approach  Arthrogram: No  Medications: 3 mL lidocaine 1 %; 40 mg methylPREDNISolone acetate 40 MG/ML Outcome: tolerated well, no immediate complications Procedure, treatment alternatives, risks and benefits explained, specific risks discussed. Consent was given by the patient. Immediately prior to procedure a time out was called to verify the correct patient, procedure, equipment, support staff and site/side marked as required. Patient was prepped and draped in the usual sterile fashion.      Clinical Data: No additional findings.   Subjective: Chief Complaint  Patient presents with   Left Knee - Pain   Right Knee - Pain  The patient is a very pleasant 30 year old female that I am seeing for the first time.  She reports bilateral knee pain for many years now.  The right knee hurts on the medial side of the knee and the left knee hurts on the lateral aspect of the knee.  She played basketball for many years.  She does report bilateral knee swelling.  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  She is not a diabetic and denies  other active medical issues.  HPI  Review of Systems There is currently listed no headache, chest pain, shortness of breath, fever, chills, nausea, vomiting  Objective: Vital Signs: Ht 5\' 8"  (1.727 m)   BMI 30.04 kg/m   Physical Exam She is alert and orient x3 and in no acute distress Ortho Exam Examination of both knees shows no effusion.  Both knees hyperextend and feel ligamentously stable.  The left knee has lateral joint line tenderness in the right knee has medial joint line tenderness with negative McMurray's exam but obvious discomfort on exam. Specialty Comments:  No specialty comments available.  Imaging: XR Knee 1-2 Views  Left  Result Date: 05/15/2021 2 views of the left knee showed no acute findings.  There is chronic calcifications around the medial femoral condyle suggesting an old MCL injury.  The medial lateral compartments have only slight narrowing.  XR Knee 1-2 Views Right  Result Date: 05/15/2021 2 views of the right knee show slight varus malalignment with medial joint space narrowing and periarticular osteophytes around the medial compartment of the knee.    PMFS History: Patient Active Problem List   Diagnosis Date Noted   Iron deficiency anemia due to chronic blood loss 09/07/2019   Indication for care in labor or delivery 08/04/2018   VBAC (vaginal birth after Cesarean) 08/04/2018   Postpartum care following vaginal delivery 08/04/2018   Active labor 02/02/2016   Twin pregnancy, delivered by cesarean section, current hospitalization 02/02/2016   Postpartum care following cesarean delivery 02/02/2016   Pregnancy 02/21/2015   SVD (spontaneous vaginal delivery) 02/21/2015   Screening, antenatal, for fetal anatomic survey    Low lying placenta with hemorrhage in third trimester, antepartum    Past Medical History:  Diagnosis Date   Anemia    states "supposed to be on Iron, but has not been taking it".   Blood transfusion without reported diagnosis 2017   emergency c section placental abruption   Fibroid    "I have had them for years".    Gall stones    Pregnancy induced hypertension    with first preg   VBAC (vaginal birth after Cesarean) 08/04/2018    Family History  Problem Relation Age of Onset   Diabetes Sister    Asthma Sister    Miscarriages / Stillbirths Sister    Diabetes Maternal Grandmother    Stroke Maternal Grandmother    Arthritis Neg Hx    Alcohol abuse Neg Hx    Birth defects Neg Hx    Cancer Neg Hx    COPD Neg Hx    Depression Neg Hx    Drug abuse Neg Hx    Early death Neg Hx    Hearing loss Neg Hx    Heart disease Neg Hx    Hyperlipidemia Neg Hx     Hypertension Neg Hx    Kidney disease Neg Hx    Learning disabilities Neg Hx    Mental illness Neg Hx    Mental retardation Neg Hx    Vision loss Neg Hx    Varicose Veins Neg Hx     Past Surgical History:  Procedure Laterality Date   CESAREAN SECTION N/A 02/02/2016   Procedure: CESAREAN SECTION;  Surgeon: Sherlyn Hay, DO;  Location: [facility];  Service: Obstetrics;  Laterality: N/A;   NO PAST SURGERIES     Social History   Occupational History   Not on file  Tobacco Use   Smoking status: Never   Smokeless  tobacco: Never  Vaping Use   Vaping Use: Never used  Substance and Sexual Activity   Alcohol use: No   Drug use: No   Sexual activity: Yes    Birth control/protection: None

## 2021-05-16 ENCOUNTER — Telehealth: Payer: Self-pay | Admitting: Orthopaedic Surgery

## 2021-05-16 NOTE — Telephone Encounter (Signed)
05/15/21 ov note faxed to Hannah @ [facility] [phone]

## 2021-06-02 ENCOUNTER — Other Ambulatory Visit: Payer: Self-pay

## 2021-06-02 ENCOUNTER — Ambulatory Visit: Payer: Medicaid Other | Attending: Orthopaedic Surgery

## 2021-06-02 DIAGNOSIS — M25562 Pain in left knee: Secondary | ICD-10-CM | POA: Insufficient documentation

## 2021-06-02 DIAGNOSIS — R262 Difficulty in walking, not elsewhere classified: Secondary | ICD-10-CM | POA: Diagnosis present

## 2021-06-02 DIAGNOSIS — M25561 Pain in right knee: Secondary | ICD-10-CM | POA: Diagnosis present

## 2021-06-02 DIAGNOSIS — G8929 Other chronic pain: Secondary | ICD-10-CM

## 2021-06-02 NOTE — Therapy (Addendum)
[place], [place], [address] Phone: [phone]   Fax:  [phone]  Physical Therapy Evaluation / Discharge  Patient Details  Name: Cira Deyoe MRN: [id number] Date of Birth: [birth year] Referring Provider (PT): Mcarthur Rossetti, MD   Encounter Date: 06/02/2021   PT End of Session - 06/02/21 1345     Visit Number 1    Number of Visits 9    Date for PT Re-Evaluation 07/28/21    Authorization Type MCD - [employer]    PT Start Time 1300    PT Stop Time 1345    PT Time Calculation (min) 45 min    Activity Tolerance Patient tolerated treatment well    Behavior During Therapy [facility] for tasks assessed/performed             Past Medical History:  Diagnosis Date   Anemia    states "supposed to be on Iron, but has not been taking it".   Blood transfusion without reported diagnosis 2017   emergency c section placental abruption   Fibroid    "I have had them for years".    Gall stones    Pregnancy induced hypertension    with first preg   VBAC (vaginal birth after Cesarean) 08/04/2018    Past Surgical History:  Procedure Laterality Date   CESAREAN SECTION N/A 02/02/2016   Procedure: CESAREAN SECTION;  Surgeon: Sherlyn Hay, DO;  Location: [facility];  Service: Obstetrics;  Laterality: N/A;   NO PAST SURGERIES      There were no vitals filed for this visit.    Subjective Assessment - 06/02/21 1311     Subjective The patient reports that she remembers always having bilateral knee pain.  She reports that it has progressed over the last few years.  She did get an injection about 2 weeks ago and she feels like it has about woren off.  She reports that there is a constant pain in the knees.  Her pain will increase with squatting, walking too hard, prolong sitting,  stairs, and running/jumping.  She points to the lateral knees as the location of the pain.  She reports that her knees can give  out on her.    Limitations Lifting;Walking;House hold activities;Sitting    How long can you sit comfortably? 20 minutes    How long can you walk comfortably? 1 hour    Diagnostic tests x-rays:2 views of the left knee showed no acute findings.  There is chronic   calcifications around the medial femoral condyle suggesting an old MCL   injury.  The medial lateral compartments have only slight narrowing.2 views of the right knee show slight varus malalignment with medial joint   space narrowing and periarticular osteophytes around the medial   compartment of the knee.    Patient Stated Goals To stabilize the knees and lessen the knee pain    Currently in Pain? Yes    Pain Score 6     Pain Location Knee    Pain Orientation Left;Right    Pain Descriptors / Indicators Aching    Pain Type Chronic pain    Pain Onset More than a month ago    Pain Frequency Constant    Aggravating Factors  prolong sitting, squatting, stairs, hard walking, running/jogging    Pain Relieving Factors stretches                [facility] PT Assessment - 06/02/21 0001  Assessment   Medical Diagnosis M25.562,G89.29 (ICD-10-CM) - Chronic pain of left knee  M25.561,G89.29 (ICD-10-CM) - Chronic pain of right knee    Referring Provider (PT) Mcarthur Rossetti, MD    Onset Date/Surgical Date --   over the last few years   Hand Dominance Right    Next MD Visit none    Prior Therapy no      Precautions   Precautions None      Restrictions   Weight Bearing Restrictions No      Balance Screen   Has the patient fallen in the past 6 months Yes    How many times? 1    Has the patient had a decrease in activity level because of a fear of falling?  No    Is the patient reluctant to leave their home because of a fear of falling?  No      Home Ecologist residence    Living Arrangements Spouse/significant other;Children    Type of [place] One level      Prior  Function   Level of Independence Independent    Vocation Part time employment    Vocation Requirements Work from home      Cognition   Overall Cognitive Status Within Functional Limits for tasks assessed      Functional Tests   Functional tests Squat;Sit to [employer]   Comments 1/3 depth      Single Leg Stance   Comments Right 25 sec ; Left 30 sec      Sit to Stand   Comments use of hands bilateraly      ROM / Strength   AROM / PROM / Strength AROM;Strength      AROM   Right Knee Extension 5   hyperextension   Right Knee Flexion 120    Left Knee Extension 10   hyperextension   Left Knee Flexion 130      Strength   Right Hip ABduction 4/5    Left Hip ABduction 4/5    Right Knee Flexion 5/5    Right Knee Extension 4+/5    Left Knee Flexion 5/5    Left Knee Extension 4+/5      Flexibility   Hamstrings mild limits    Quadriceps mod limits    ITB mild/mod limits                        Objective measurements completed on examination: See above findings.       [facility] Adult PT Treatment/Exercise - 06/02/21 0001       Exercises   Exercises Knee/Hip      Knee/Hip Exercises: Stretches   ITB Stretch Left;Right;3 reps      Knee/Hip Exercises: Supine   Quad Sets Both;1 set;15 reps    Straight Leg Raises Right;Left;1 set;15 reps      Knee/Hip Exercises: Sidelying   Hip ABduction Strengthening;Right;Left;1 set;20 reps    Hip ABduction Limitations clam shell  GTB                    PT Education - 06/02/21 1335     Education Details POC, anatomy, HEP    Person(s) Educated Patient    Methods Explanation    Comprehension Verbalized understanding              PT Short Term Goals - 06/02/21 1335  PT SHORT TERM GOAL #1   Title The patient will be independent in a basic HEP    Baseline no HEP    Time 2    Period Weeks    Status New    Target Date 06/16/21               PT Long Term Goals - 06/02/21 1337        PT LONG TERM GOAL #1   Title The patient will be able to squat to 1/2 depth    Baseline 1/3 depth    Time 8    Period Weeks    Status New    Target Date 07/28/21      PT LONG TERM GOAL #2   Title The patient will be able to perform a flight of stairs with bilateral knee pain equal or under a 4/10    Baseline 6/10    Time 8    Period Weeks    Status New    Target Date 07/28/21      PT LONG TERM GOAL #3   Title The patient will be able to transfer sit to stand without use of hands    Baseline sit to stand use of hands Bilaterally    Time 8    Period Weeks    Status New    Target Date 07/28/21                    Plan - 06/02/21 1340     Clinical Impression Statement Ersie is a 30 y/o female who reports a long history of bilateral knee pain.  She reports that is has progressively gotten worse over the last few years.  She is currently limited with prolong sitting, squatting, stairs, and hard walking.  The patient presents with tightness of the ITB and lateral retinaculum.  She presents with quadriceps snd hip abductor weakness.  Recommend physical therapy for strengthening, flexility, and graudual progression of activities for increase of functional ability.    Examination-Activity Limitations Carry;Lift;Stairs;Squat;Sit;Locomotion Level;Transfers    Examination-Participation Restrictions Volunteer;Laundry;Shop;Meal Prep;Cleaning    Stability/Clinical Decision Making Stable/Uncomplicated    Clinical Decision Making Low    Rehab Potential Good    PT Frequency 1x / week    PT Duration 8 weeks    PT Treatment/Interventions ADLs/Self Care Home Management;Electrical Stimulation;Balance training;Neuromuscular re-education;Therapeutic exercise;Therapeutic activities;Functional mobility training;Stair training;Gait training;Patient/family education;Manual techniques;Taping    PT Next Visit Plan bike, quad strengthening, quad stretches if tolerated, STM/patella mobs, hip  abductor strengthening    PT Home Exercise Plan See pt Instructions    Consulted and Agree with Plan of Care Patient             Patient will benefit from skilled therapeutic intervention in order to improve the following deficits and impairments:  Decreased range of motion, Difficulty walking, Decreased activity tolerance, Pain, Decreased mobility, Decreased strength, Impaired flexibility  Visit Diagnosis: Chronic pain of both knees - Plan: PT plan of care cert/re-cert  Difficulty in walking, not elsewhere classified - Plan: PT plan of care cert/re-cert     Problem List Patient Active Problem List   Diagnosis Date Noted   Iron deficiency anemia due to chronic blood loss 09/07/2019   Indication for care in labor or delivery 08/04/2018   VBAC (vaginal birth after Cesarean) 08/04/2018   Postpartum care following vaginal delivery 08/04/2018   Active labor 02/02/2016   Twin pregnancy, delivered by cesarean section, current hospitalization 02/02/2016   Postpartum  care following cesarean delivery 02/02/2016   Pregnancy 02/21/2015   SVD (spontaneous vaginal delivery) 02/21/2015   Screening, antenatal, for fetal anatomic survey    Low lying placenta with hemorrhage in third trimester, antepartum     Rich Number, PT, DPT, OCS, Crt. DN  Bethena Midget 06/02/2021, 2:46 PM   06/26/21:  The patient has not returned to therapy.  She no showed to her scheduled appointments.  She will be discharged from therapy at this time.  Unable to perform assessment or update goals due to non-return to therapy  Rich Number, PT, DPT, OCS, Crt. DN  Check all possible CPT codes: 17408- Therapeutic Exercise, [phone]- Neuro Re-education, [phone] - Gait Training, [id number] - Manual Therapy, 97530 - Therapeutic Activities, [phone] - Self Care, [phone] - Mechanical traction, 97014 - Electrical stimulation (unattended), [id number] - Electrical stimulation (Manual), [id number] - Iontophoresis, [id number] - Ultrasound, and [id number] - Aquatic  therapy        [facility] [address] [place], [place], [address] Phone: [phone]   Fax:  [phone]  Name: Eadie Repetto MRN: [id number] Date of Birth: Jun 22, 1991

## 2021-06-02 NOTE — Patient Instructions (Signed)
Home Exercise Program [[id number]]  Quad Sets with VMO -  Repeat 15 Times, Hold 5 Seconds, Complete 1 Set, Perform 1 Times a Day  ILIOTIBIAL BAND STRETCH WITH BELT - ITB -  Repeat 3 Times, Hold 30 Seconds, Complete 1 Set, Perform 1 Times a Day  STRAIGHT LEG RAISE - SLR -  Repeat 15 Times, Hold 3 Seconds, Complete 1 Set, Perform 1 Times a Day  ELASTIC BAND - SIDE LYING CLAM SHELL - [place]  -  Repeat 20 Times, Hold 1 Second(s), Complete 1 Set, Perform 1 Times a Day

## 2021-06-12 ENCOUNTER — Ambulatory Visit: Payer: Medicaid Other

## 2021-06-12 ENCOUNTER — Telehealth: Payer: Self-pay

## 2021-06-12 NOTE — Telephone Encounter (Signed)
Unable to leave voicemail regarding patient's missed PT appointment. Plan to review attendance policy at next session.

## 2021-06-19 ENCOUNTER — Telehealth: Payer: Self-pay

## 2021-06-19 ENCOUNTER — Ambulatory Visit: Payer: Medicaid Other

## 2021-06-19 NOTE — Telephone Encounter (Signed)
The patient no showed to her appointment today.  Her husband answered the phone and he is down as a contact.  Informed him that she will need to call us back to schedule.  Rich Number, PT, DPT, OCS, Crt. DN

## 2021-06-21 NOTE — Telephone Encounter (Signed)
error 

## 2021-12-10 ENCOUNTER — Emergency Department (HOSPITAL_BASED_OUTPATIENT_CLINIC_OR_DEPARTMENT_OTHER)
Admission: EM | Admit: 2021-12-10 | Discharge: 2021-12-10 | Disposition: A | Discharge status: Home / Self Care | Payer: Medicaid Other | Attending: Emergency Medicine | Admitting: Emergency Medicine

## 2021-12-10 ENCOUNTER — Encounter (HOSPITAL_BASED_OUTPATIENT_CLINIC_OR_DEPARTMENT_OTHER): Payer: Self-pay | Admitting: Emergency Medicine

## 2021-12-10 ENCOUNTER — Other Ambulatory Visit: Payer: Self-pay

## 2021-12-10 ENCOUNTER — Emergency Department (HOSPITAL_BASED_OUTPATIENT_CLINIC_OR_DEPARTMENT_OTHER): Payer: Medicaid Other | Admitting: Radiology

## 2021-12-10 DIAGNOSIS — G8911 Acute pain due to trauma: Secondary | ICD-10-CM | POA: Diagnosis not present

## 2021-12-10 DIAGNOSIS — M25562 Pain in left knee: Secondary | ICD-10-CM | POA: Insufficient documentation

## 2021-12-10 DIAGNOSIS — X509XXA Other and unspecified overexertion or strenuous movements or postures, initial encounter: Secondary | ICD-10-CM | POA: Insufficient documentation

## 2021-12-10 MED ORDER — IBUPROFEN 400 MG PO TABS
600.0000 mg | ORAL_TABLET | Freq: Once | ORAL | Status: AC
  Administered 2021-12-10: 600 mg via ORAL
  Filled 2021-12-10: qty 1

## 2021-12-10 NOTE — ED Notes (Signed)
ED Provider at bedside. 

## 2021-12-10 NOTE — ED Notes (Signed)
EMT-P provided AVS using Teachback Method. Patient verbalizes understanding of Discharge Instructions. Opportunity for Questioning and Answers were provided by EMT-P. Patient Discharged from ED.  ? ?

## 2021-12-10 NOTE — Discharge Instructions (Signed)
You may have another injury to the ligaments.  I recommend that you wear the brace at all times when walking.  You can take it off when sleeping and when showering.  Please use crutches and keep all weight off of your left leg until you are seen in the office.

## 2021-12-10 NOTE — ED Provider Notes (Signed)
[facility] Provider Note   CSN: [id number] Arrival date & time: 12/10/21  1910     History  Chief Complaint  Patient presents with   Knee Pain    Alyssa Hubbard is a 31 y.o. female presented emerged part with pain in her left knee.  She suffers from chronic left knee pain, has torn her MCL in the past.  She was walking on the stairs today when she felt a pop and felt that there was severe pain in her left knee.  Difficulty bearing full weight.  No other injuries reported.  HPI     Home Medications Prior to Admission medications   Medication Sig Start Date End Date Taking? Authorizing Provider  cholecalciferol (VITAMIN D3) 25 MCG (1000 UT) tablet Take 1,000 Units by mouth daily. Patient not taking: Reported on 06/02/2021    [provider]  ferrous sulfate 325 (65 FE) MG EC tablet Take 1 tablet (325 mg total) by mouth 2 (two) times daily. Patient taking differently: Take 325 mg by mouth daily. 02/23/15   Newton Pigg, MD      Allergies    Patient has no known allergies.    Review of Systems   Review of Systems  Physical Exam Updated Vital Signs BP 136/82 (BP Location: Left Arm)    Pulse 82    Temp 98.5 F (36.9 C) (Oral)    Resp 18    Ht 5\' 8"  (1.727 m)    Wt 106.6 kg    LMP 11/22/2021    SpO2 100%    BMI 35.73 kg/m  Physical Exam Constitutional:      General: She is not in acute distress. HENT:     Head: Normocephalic and atraumatic.  Eyes:     Conjunctiva/sclera: Conjunctivae normal.     Pupils: Pupils are equal, round, and reactive to light.  Cardiovascular:     Rate and Rhythm: Normal rate and regular rhythm.     Pulses: Normal pulses.  Pulmonary:     Effort: Pulmonary effort is normal. No respiratory distress.  Musculoskeletal:     Comments: Moderate effusion palpable around the left knee.  Patient is able to actively and passively range knee to 90 degrees.  No joint instability.  Skin:    General: Skin is warm and  dry.  Neurological:     General: No focal deficit present.     Mental Status: She is alert and oriented to person, place, and time. Mental status is at baseline.  Psychiatric:        Mood and Affect: Mood normal.        Behavior: Behavior normal.    ED Results / Procedures / Treatments   Labs (all labs ordered are listed, but only abnormal results are displayed) Labs Reviewed - No data to display  EKG None  Radiology DG Knee Complete 4 Views Left  Result Date: 12/10/2021 CLINICAL DATA:  Left knee pain. EXAM: LEFT KNEE - COMPLETE 4+ VIEW COMPARISON:  June 14, 2017 FINDINGS: No evidence of an acute fracture or dislocation. A chronic appearing curvilinear area of calcification is seen along the medial epicondyle of the distal right humerus. A moderate sized joint effusion is seen. IMPRESSION: Moderate sized joint effusion without an acute osseous abnormality. Electronically Signed   By: Virgina Norfolk M.D.   On: 12/10/2021 20:15    Procedures Procedures    Medications Ordered in ED Medications  ibuprofen (ADVIL) tablet 600 mg (600 mg Oral Given 12/10/21 1940)  ED Course/ Medical Decision Making/ A&P                           Medical Decision Making  Patient is here with left knee injury, effusion on exam, suspect this may again be a MCL or LCL injury.    She is neurovascular intact.  I doubt a septic joint.  X-rays are personally reviewed showing no acute fractures.  I will place her in a knee immobilizer and give her crutches and have her follow-up with Ortho.        Final Clinical Impression(s) / ED Diagnoses Final diagnoses:  Acute pain of left knee    Rx / DC Orders ED Discharge Orders     None         Wyvonnia Dusky, MD 12/11/21 [phone]

## 2021-12-10 NOTE — ED Triage Notes (Signed)
°  Patient comes in with L knee pain that started earlier this morning around 0930.  Patient states she was walking down a flight of stairs and felt a pop in her left medial knee area.  Patient states it feels tender and unable to bear weight on it.  Pain 5/10, throbbing.  No obvious deformity.  Patient states it is swollen.  Tenderness on L medial knee.

## 2022-01-08 ENCOUNTER — Other Ambulatory Visit: Payer: Self-pay

## 2022-01-08 ENCOUNTER — Encounter: Payer: Self-pay | Admitting: Physician Assistant

## 2022-01-08 ENCOUNTER — Ambulatory Visit (INDEPENDENT_AMBULATORY_CARE_PROVIDER_SITE_OTHER): Payer: Medicaid Other | Admitting: Physician Assistant

## 2022-01-08 DIAGNOSIS — M25561 Pain in right knee: Secondary | ICD-10-CM

## 2022-01-08 DIAGNOSIS — M25562 Pain in left knee: Secondary | ICD-10-CM

## 2022-01-08 DIAGNOSIS — G8929 Other chronic pain: Secondary | ICD-10-CM

## 2022-01-08 NOTE — Progress Notes (Signed)
Office Visit Note   Patient: Alyssa Hubbard           Date of Birth: 08-16-1991           MRN: [id number] Visit Date: 01/08/2022              Requested by: Harlan Stains, MD [address],  [place] [id number] PCP: Harlan Stains, MD   Assessment & Plan: Visit Diagnoses:  1. Chronic pain of left knee   2. Chronic pain of right knee     Plan: Given her mechanical block in the knee today and the fact that she is having mechanical symptoms of the left knee recommend MRI to rule out meniscal tear.  Have her back once the studies are available.  She did tolerate the aspiration injection left knee well today.  Quad strengthening was encouraged.  Follow-Up Instructions: Return for After MRI.   Orders:  No orders of the defined types were placed in this encounter.  No orders of the defined types were placed in this encounter.     Procedures: No procedures performed   Clinical Data: No additional findings.   Subjective: Chief Complaint  Patient presents with   Left Knee - Pain    HPI Alyssa Hubbard is a 31 year old female who was last seen in June due to bilateral knee pain.  She was given cortisone injections both knees she states this only helped for about a week.  She did go to therapy for short period of time.  She has continued to have pain particularly in the left knee.  She reports that she was recently walking down some steps when she felt a pop in her knee that was particularly painful.  Since that time she has had swelling left knee.  She is unable to straighten the knee completely.  She has giving way catching and painful popping left knee.  Right knee no mechanical symptoms.  Left knee she describes her pain to be about the kneecap and medial joint line.  Right knee pain is mostly medial.  Review of Systems  Constitutional:  Negative for chills and fever.  Musculoskeletal:  Positive for joint swelling.    Objective: Vital Signs: There were no  vitals taken for this visit.  Physical Exam Constitutional:      Appearance: She is not ill-appearing or diaphoretic.  Pulmonary:     Effort: Pulmonary effort is normal.  Neurological:     Mental Status: She is alert and oriented to person, place, and time.  Psychiatric:        Mood and Affect: Mood normal.    Ortho Exam Bilateral knees left knee extension lag of approximately 10 degrees actively and passively.  No gross instability valgus varus stress in either knee.  Tenderness along medial joint line of the left knee.  Left knee with slight effusion no abnormal warmth erythema.  Right knee full range of motion without pain. Specialty Comments:  No specialty comments available.  Imaging: No results found.   PMFS History: Patient Active Problem List   Diagnosis Date Noted   Iron deficiency anemia due to chronic blood loss 09/07/2019   Indication for care in labor or delivery 08/04/2018   VBAC (vaginal birth after Cesarean) 08/04/2018   Postpartum care following vaginal delivery 08/04/2018   Active labor 02/02/2016   Twin pregnancy, delivered by cesarean section, current hospitalization 02/02/2016   Postpartum care following cesarean delivery 02/02/2016   Pregnancy 02/21/2015   SVD (spontaneous  vaginal delivery) 02/21/2015   Screening, antenatal, for fetal anatomic survey    Low lying placenta with hemorrhage in third trimester, antepartum    Past Medical History:  Diagnosis Date   Anemia    states "supposed to be on Iron, but has not been taking it".   Blood transfusion without reported diagnosis 2017   emergency c section placental abruption   Fibroid    "I have had them for years".    Gall stones    Pregnancy induced hypertension    with first preg   VBAC (vaginal birth after Cesarean) 08/04/2018    Family History  Problem Relation Age of Onset   Diabetes Sister    Asthma Sister    Miscarriages / Stillbirths Sister    Diabetes Maternal Grandmother    Stroke  Maternal Grandmother    Arthritis Neg Hx    Alcohol abuse Neg Hx    Birth defects Neg Hx    Cancer Neg Hx    COPD Neg Hx    Depression Neg Hx    Drug abuse Neg Hx    Early death Neg Hx    Hearing loss Neg Hx    Heart disease Neg Hx    Hyperlipidemia Neg Hx    Hypertension Neg Hx    Kidney disease Neg Hx    Learning disabilities Neg Hx    Mental illness Neg Hx    Mental retardation Neg Hx    Vision loss Neg Hx    Varicose Veins Neg Hx     Past Surgical History:  Procedure Laterality Date   CESAREAN SECTION N/A 02/02/2016   Procedure: CESAREAN SECTION;  Surgeon: Sherlyn Hay, DO;  Location: [facility];  Service: Obstetrics;  Laterality: N/A;   NO PAST SURGERIES     Social History   Occupational History   Not on file  Tobacco Use   Smoking status: Never   Smokeless tobacco: Never  Vaping Use   Vaping Use: Never used  Substance and Sexual Activity   Alcohol use: No   Drug use: No   Sexual activity: Yes    Birth control/protection: None

## 2022-01-08 NOTE — Addendum Note (Signed)
Addended by: Robyne Peers on: 01/08/2022 11:25 AM   Modules accepted: Orders

## 2022-01-23 ENCOUNTER — Other Ambulatory Visit: Payer: Self-pay

## 2022-01-23 ENCOUNTER — Ambulatory Visit
Admission: RE | Admit: 2022-01-23 | Discharge: 2022-01-23 | Disposition: A | Discharge status: Home / Self Care | Payer: Medicaid Other | Source: Ambulatory Visit | Attending: Physician Assistant | Admitting: Physician Assistant

## 2022-01-23 DIAGNOSIS — M25562 Pain in left knee: Secondary | ICD-10-CM

## 2022-01-23 DIAGNOSIS — G8929 Other chronic pain: Secondary | ICD-10-CM

## 2022-03-29 ENCOUNTER — Emergency Department (HOSPITAL_BASED_OUTPATIENT_CLINIC_OR_DEPARTMENT_OTHER)
Admission: EM | Admit: 2022-03-29 | Discharge: 2022-03-29 | Disposition: A | Discharge status: Home / Self Care | Payer: Medicaid Other | Attending: Emergency Medicine | Admitting: Emergency Medicine

## 2022-03-29 ENCOUNTER — Encounter (HOSPITAL_BASED_OUTPATIENT_CLINIC_OR_DEPARTMENT_OTHER): Payer: Self-pay | Admitting: *Deleted

## 2022-03-29 ENCOUNTER — Other Ambulatory Visit: Payer: Self-pay

## 2022-03-29 DIAGNOSIS — R1011 Right upper quadrant pain: Secondary | ICD-10-CM | POA: Diagnosis not present

## 2022-03-29 DIAGNOSIS — D649 Anemia, unspecified: Secondary | ICD-10-CM | POA: Diagnosis not present

## 2022-03-29 DIAGNOSIS — R109 Unspecified abdominal pain: Secondary | ICD-10-CM

## 2022-03-29 LAB — URINALYSIS, ROUTINE W REFLEX MICROSCOPIC
Bilirubin Urine: NEGATIVE
Glucose, UA: NEGATIVE mg/dL
Hgb urine dipstick: NEGATIVE
Ketones, ur: 15 mg/dL — AB
Leukocytes,Ua: NEGATIVE
Nitrite: NEGATIVE
Protein, ur: NEGATIVE mg/dL
Specific Gravity, Urine: >=1.03 (ref 1.005–1.030)
pH: 7 (ref 5.0–8.0)

## 2022-03-29 LAB — CBC
HCT: 26.9 % — ABNORMAL LOW (ref 36.0–46.0)
Hemoglobin: 7.7 g/dL — ABNORMAL LOW (ref 12.0–15.0)
MCH: 19.9 pg — ABNORMAL LOW (ref 26.0–34.0)
MCHC: 28.6 g/dL — ABNORMAL LOW (ref 30.0–36.0)
MCV: 69.7 fL — ABNORMAL LOW (ref 80.0–100.0)
Platelets: 265 10*3/uL (ref 150–400)
RBC: 3.86 MIL/uL — ABNORMAL LOW (ref 3.87–5.11)
RDW: 17.5 % — ABNORMAL HIGH (ref 11.5–15.5)
WBC: 7.8 10*3/uL (ref 4.0–10.5)
nRBC: 0 % (ref 0.0–0.2)

## 2022-03-29 LAB — COMPREHENSIVE METABOLIC PANEL
ALT: 10 U/L (ref 0–44)
AST: 12 U/L — ABNORMAL LOW (ref 15–41)
Albumin: 4 g/dL (ref 3.5–5.0)
Alkaline Phosphatase: 36 U/L — ABNORMAL LOW (ref 38–126)
Anion gap: 8 (ref 5–15)
BUN: 15 mg/dL (ref 6–20)
CO2: 23 mmol/L (ref 22–32)
Calcium: 9 mg/dL (ref 8.9–10.3)
Chloride: 103 mmol/L (ref 98–111)
Creatinine, Ser: 0.57 mg/dL (ref 0.44–1.00)
GFR, Estimated: >60 mL/min (ref >60)
Glucose, Bld: 108 mg/dL — ABNORMAL HIGH (ref 70–99)
Potassium: 3.5 mmol/L (ref 3.5–5.1)
Sodium: 134 mmol/L — ABNORMAL LOW (ref 135–145)
Total Bilirubin: 0.4 mg/dL (ref 0.3–1.2)
Total Protein: 8.1 g/dL (ref 6.5–8.1)

## 2022-03-29 LAB — LIPASE, BLOOD: Lipase: 23 U/L (ref 11–51)

## 2022-03-29 LAB — PREGNANCY, URINE: Preg Test, Ur: NEGATIVE

## 2022-03-29 NOTE — ED Provider Notes (Signed)
?[facility] ?Provider Note ? ? ?CSN: [id number] ?Arrival date & time: 03/29/22  0142 ? ?  ? ?History ? ?Chief Complaint  ?Patient presents with  ? Abdominal Pain  ? ? ?Alyssa Hubbard is a 31 y.o. female. ? ? ?Abdominal Pain ?Pain location:  RUQ ?Pain quality: sharp   ?Pain quality: not aching   ?Pain severity:  Moderate ?Onset quality:  Gradual ?Duration:  2 hours ?Timing:  Constant ?Progression:  Resolved ?Chronicity:  Recurrent ?Context: eating   ?Context: not alcohol use   ?Relieved by:  Lying down ? ?  ? ?Home Medications ?Prior to Admission medications   ?Medication Sig Start Date End Date Taking? Authorizing Provider  ?ferrous sulfate 325 (65 FE) MG EC tablet Take 1 tablet (325 mg total) by mouth 2 (two) times daily. ?Patient taking differently: Take 325 mg by mouth daily. 02/23/15   Newton Pigg, MD  ?   ? ?Allergies    ?Patient has no known allergies.   ? ?Review of Systems   ?Review of Systems  ?Gastrointestinal:  Positive for abdominal pain.  ? ?Physical Exam ?Updated Vital Signs ?BP 132/84 (BP Location: Left Arm)   Pulse 70   Temp 98.1 ?F (36.7 ?C) (Oral)   Resp 18   LMP 03/13/2022 (Approximate)   SpO2 98%  ?Physical Exam ?Vitals and nursing note reviewed.  ?Constitutional:   ?   Appearance: She is well-developed.  ?HENT:  ?   Head: Normocephalic and atraumatic.  ?   Mouth/Throat:  ?   Mouth: Mucous membranes are moist.  ?   Pharynx: Oropharynx is clear.  ?Eyes:  ?   Pupils: Pupils are equal, round, and reactive to light.  ?Cardiovascular:  ?   Rate and Rhythm: Normal rate and regular rhythm.  ?Pulmonary:  ?   Effort: No respiratory distress.  ?   Breath sounds: No stridor.  ?Abdominal:  ?   General: There is no distension.  ?   Palpations: Abdomen is soft.  ?   Tenderness: There is no abdominal tenderness.  ?Musculoskeletal:     ?   General: No swelling or tenderness. Normal range of motion.  ?   Cervical back: Normal range of motion.  ?Skin: ?   General: Skin is warm  and dry.  ?Neurological:  ?   General: No focal deficit present.  ?   Mental Status: She is alert.  ? ? ?ED Results / Procedures / Treatments   ?Labs ?(all labs ordered are listed, but only abnormal results are displayed) ?Labs Reviewed  ?COMPREHENSIVE METABOLIC PANEL - Abnormal; Notable for the following components:  ?    Result Value  ? Sodium 134 (*)   ? Glucose, Bld 108 (*)   ? AST 12 (*)   ? Alkaline Phosphatase 36 (*)   ? All other components within normal limits  ?CBC - Abnormal; Notable for the following components:  ? RBC 3.86 (*)   ? Hemoglobin 7.7 (*)   ? HCT 26.9 (*)   ? MCV 69.7 (*)   ? MCH 19.9 (*)   ? MCHC 28.6 (*)   ? RDW 17.5 (*)   ? All other components within normal limits  ?URINALYSIS, ROUTINE W REFLEX MICROSCOPIC - Abnormal; Notable for the following components:  ? Ketones, ur 15 (*)   ? All other components within normal limits  ?LIPASE, BLOOD  ?PREGNANCY, URINE  ? ? ?EKG ?None ? ?Radiology ?No results found. ? ?Procedures ?Procedures  ? ? ?Medications  Ordered in ED ?Medications - No data to display ? ?ED Course/ Medical Decision Making/ A&P ?  ?                        ?Medical Decision Making ?Amount and/or Complexity of Data Reviewed ?Labs: ordered. ? ? ?Pain resolved prior to my eval. Normal labs. Abdomen benign. Only has issues once a year or so. Will follow up with [facility]GS if it becomes more frequent.  ? ? ?Final Clinical Impression(s) / ED Diagnoses ?Final diagnoses:  ?Abdominal pain, unspecified abdominal location  ?Anemia, unspecified type  ? ? ?Rx / DC Orders ?ED Discharge Orders   ? ? None  ? ?  ? ? ?  ?Merrily Pew, MD ?03/29/22 1499 ? ?

## 2022-03-29 NOTE — ED Triage Notes (Signed)
Upper abdominal pain started around 2100 tonight. Nausea, vomiting x 1. States hx of issues with her gallbladder.  ?

## 2023-04-13 ENCOUNTER — Emergency Department (HOSPITAL_BASED_OUTPATIENT_CLINIC_OR_DEPARTMENT_OTHER): Payer: Medicaid Other

## 2023-04-13 ENCOUNTER — Encounter (HOSPITAL_BASED_OUTPATIENT_CLINIC_OR_DEPARTMENT_OTHER): Payer: Self-pay

## 2023-04-13 ENCOUNTER — Emergency Department (HOSPITAL_BASED_OUTPATIENT_CLINIC_OR_DEPARTMENT_OTHER)
Admission: EM | Admit: 2023-04-13 | Discharge: 2023-04-14 | Disposition: A | Discharge status: Home / Self Care | Payer: Medicaid Other | Attending: Emergency Medicine | Admitting: Emergency Medicine

## 2023-04-13 ENCOUNTER — Other Ambulatory Visit: Payer: Self-pay

## 2023-04-13 DIAGNOSIS — N939 Abnormal uterine and vaginal bleeding, unspecified: Secondary | ICD-10-CM | POA: Insufficient documentation

## 2023-04-13 DIAGNOSIS — D649 Anemia, unspecified: Secondary | ICD-10-CM | POA: Diagnosis not present

## 2023-04-13 DIAGNOSIS — D219 Benign neoplasm of connective and other soft tissue, unspecified: Secondary | ICD-10-CM

## 2023-04-13 DIAGNOSIS — R9389 Abnormal findings on diagnostic imaging of other specified body structures: Secondary | ICD-10-CM | POA: Diagnosis not present

## 2023-04-13 LAB — CBC
HCT: 24.2 % — ABNORMAL LOW (ref 36.0–46.0)
Hemoglobin: 6.6 g/dL — CL (ref 12.0–15.0)
MCH: 18.4 pg — ABNORMAL LOW (ref 26.0–34.0)
MCHC: 27.3 g/dL — ABNORMAL LOW (ref 30.0–36.0)
MCV: 67.4 fL — ABNORMAL LOW (ref 80.0–100.0)
Platelets: 238 10*3/uL (ref 150–400)
RBC: 3.59 MIL/uL — ABNORMAL LOW (ref 3.87–5.11)
RDW: 17.9 % — ABNORMAL HIGH (ref 11.5–15.5)
WBC: 6.5 10*3/uL (ref 4.0–10.5)
nRBC: 0 % (ref 0.0–0.2)

## 2023-04-13 LAB — BPAM RBC
Blood Product Expiration Date: 202405242359
ISSUE DATE / TIME: 202405182149

## 2023-04-13 LAB — BASIC METABOLIC PANEL
Anion gap: 10 (ref 5–15)
BUN: 13 mg/dL (ref 6–20)
CO2: 23 mmol/L (ref 22–32)
Calcium: 8.9 mg/dL (ref 8.9–10.3)
Chloride: 101 mmol/L (ref 98–111)
Creatinine, Ser: 0.64 mg/dL (ref 0.44–1.00)
GFR, Estimated: >60 mL/min (ref >60)
Glucose, Bld: 88 mg/dL (ref 70–99)
Potassium: 3.6 mmol/L (ref 3.5–5.1)
Sodium: 134 mmol/L — ABNORMAL LOW (ref 135–145)

## 2023-04-13 LAB — PREPARE RBC (CROSSMATCH)

## 2023-04-13 LAB — PROTIME-INR
INR: 1 (ref 0.8–1.2)
Prothrombin Time: 13.5 seconds (ref 11.4–15.2)

## 2023-04-13 LAB — TYPE AND SCREEN
ABO/RH(D): A POS
Antibody Screen: NEGATIVE

## 2023-04-13 LAB — PREGNANCY, URINE: Preg Test, Ur: NEGATIVE

## 2023-04-13 LAB — APTT: aPTT: 24 seconds (ref 24–36)

## 2023-04-13 MED ORDER — FERROUS SULFATE 325 (65 FE) MG PO TABS: 325.0000 mg | ORAL_TABLET | Freq: Three times a day (TID) | ORAL | 0 refills | Status: DC

## 2023-04-13 MED ORDER — TRANEXAMIC ACID 650 MG PO TABS: 1300.0000 mg | ORAL_TABLET | Freq: Three times a day (TID) | ORAL | 0 refills | Status: AC

## 2023-04-13 MED ORDER — SODIUM CHLORIDE 0.9% IV SOLUTION: Freq: Once | INTRAVENOUS | Status: AC

## 2023-04-13 MED ORDER — TRANEXAMIC ACID-NACL 1000-0.7 MG/100ML-% IV SOLN
1000.0000 mg | Freq: Once | INTRAVENOUS | Status: AC
  Administered 2023-04-13: 1000 mg via INTRAVENOUS
  Filled 2023-04-13: qty 100

## 2023-04-13 NOTE — Discharge Instructions (Signed)
Return for any problem.  ?

## 2023-04-13 NOTE — ED Triage Notes (Signed)
Patient stated she is on her cycle for the last two days and she is passing a lot of blood with clots. She stated she has heavy periods often but today is worse. She stated she is using 2 pads an hour.

## 2023-04-13 NOTE — ED Notes (Signed)
Called [facility] for transport at 4:48.

## 2023-04-13 NOTE — ED Provider Notes (Addendum)
Patient seen upon arrival.  Patient transferred to [facility] for blood transfusion.  Patient comfortable on arrival.  Patient understands plan of care.  She consents to blood transfusion.    Patient understands outpatient plan of care after transfusion completion.  Patient is appropriate for discharge after completion of blood transfusion.   Wynetta Fines, MD 04/13/23 Aretha Parrot    Wynetta Fines, MD 04/13/23 [phone]

## 2023-04-13 NOTE — ED Notes (Signed)
Report given to Paul, RN.

## 2023-04-13 NOTE — ED Provider Notes (Signed)
[place] [facility] Provider Note   CSN: [id number] Arrival date & time: 04/13/23  1417     History  Chief Complaint  Patient presents with   Vaginal Bleeding    Alyssa Hubbard is a 32 y.o. female.  With PMH of IDA, obesity presenting with heavy vaginal bleeding x 3 days.  Patient has known history of heavy menstrual bleeding.  She was concerned because she started passing large clots and changing her pad multiple times every hour.  She also notes bleeding through pads.  Her menstrual period started the 15th.  She sometimes has 2 menstrual periods a month.  She has felt some shortness of breath on exertion and lightheadedness but no fainting episodes, no chest pain.  She has had no fevers or abdominal pain.  She is not on any hormonal medication or contraceptives.  She has no history of blood clots of PE or DVT.  She sees Dr. Mindi Slicker of [facility] and was going to call her on Monday to make a follow-up appointment.  However due to her increasing bleeding she was seen for evaluation today.  She has not been taking her iron tablets.  She has received blood transfusion in the past for heavy menstrual bleeding.   Vaginal Bleeding      Home Medications Prior to Admission medications   Medication Sig Start Date End Date Taking? Authorizing Provider  ferrous sulfate 325 (65 FE) MG tablet Take 1 tablet (325 mg total) by mouth 3 (three) times daily with meals for 10 days. 04/13/23 04/23/23 Yes Mardene Sayer, MD  tranexamic acid (LYSTEDA) 650 MG TABS tablet Take 2 tablets (1,300 mg total) by mouth 3 (three) times daily for 5 days. 04/13/23 04/18/23 Yes Mardene Sayer, MD      Allergies    Patient has no known allergies.    Review of Systems   Review of Systems  Genitourinary:  Positive for vaginal bleeding.    Physical Exam Updated Vital Signs BP 128/74 (BP Location: Left Arm)   Pulse 83   Temp 98 F (36.7 C) (Oral)   Resp 18   Ht  5\' 8"  (1.727 m)   Wt 104.3 kg   LMP 04/11/2023 (Exact Date)   SpO2 100%   BMI 34.97 kg/m  Physical Exam Constitutional: Alert and oriented. Well appearing and in no distress. Eyes: Conjunctivae are normal. ENT      Head: Normocephalic and atraumatic. Cardiovascular: Regular rate and rhythm Respiratory: Normal respiratory effort. Breath sounds are normal.  O2 sat 100 on RA Gastrointestinal: Soft and nontender.  GU: Performed GU exam with bedside RN Andrey Campanile as chaperone.  She had normal external genitalia.  There was a dark blood blood clot at the opening of the vaginal orifice which was removed.  She does have pooling of dark blood in the fornix as well as active slow oozing from cervix.  No tenderness noted.  No bright red blood noted.  No pulsatile bleeding noted. Musculoskeletal: Normal range of motion in all extremities.      Right lower leg: No tenderness or edema.      Left lower leg: No tenderness or edema. Neurologic: Normal speech and language. No gross focal neurologic deficits are appreciated. Skin: Skin is warm, dry and intact. No rash noted. Psychiatric: Mood and affect are normal. Speech and behavior are normal.  ED Results / Procedures / Treatments   Labs (all labs ordered are listed, but only abnormal results are displayed)  Labs Reviewed  CBC - Abnormal; Notable for the following components:      Result Value   RBC 3.59 (*)    Hemoglobin 6.6 (*)    HCT 24.2 (*)    MCV 67.4 (*)    MCH 18.4 (*)    MCHC 27.3 (*)    RDW 17.9 (*)    All other components within normal limits  BASIC METABOLIC PANEL - Abnormal; Notable for the following components:   Sodium 134 (*)    All other components within normal limits  PREGNANCY, URINE  PROTIME-INR  APTT    EKG None  Radiology US Pelvis Complete  Result Date: 04/13/2023 CLINICAL DATA:  Painless menorrhagia for 2 days, history of uterine fibroids EXAM: TRANSABDOMINAL AND TRANSVAGINAL ULTRASOUND OF PELVIS TECHNIQUE: Both  transabdominal and transvaginal ultrasound examinations of the pelvis were performed. Transabdominal technique was performed for global imaging of the pelvis including uterus, ovaries, adnexal regions, and pelvic cul-de-sac. It was necessary to proceed with endovaginal exam following the transabdominal exam to visualize the endometrium and adnexal structures. COMPARISON:  None Available. FINDINGS: Uterus Measurements: 10.8 x 8.3 by 7.1 cm = volume: 293.2 mL. There are numerous calcified and noncalcified uterine fibroids, largest intramural fibroid in the uterine body measuring up to 4.3 cm in size. There are least 2 submucosal uterine fibroids which measure up to 2.9 and 3.7 cm respectively. Endometrium Thickness: 20 mm. Endometrium is distorted by the multiple uterine fibroids. No discrete mass is identified. Right ovary Measurements: 5.1 x 2.3 x 1.8 cm = volume: 11.4 mL. Normal appearance/no adnexal mass. Left ovary Measurements: 4.4 x 3.2 x 3.8 cm = volume: 28.5 mL. 3 cm minimally complex cyst versus dominant follicle identified. Normal follicles are seen elsewhere. Other findings Trace pelvic free fluid, likely physiologic. IMPRESSION: 1. Multiple uterine fibroids as above. 2. Thickened endometrium measuring 20 mm, distorted by the numerous uterine fibroids. If bleeding remains unresponsive to hormonal or medical therapy, focal lesion work-up with sonohysterogram should be considered. Endometrial biopsy should also be considered in pre-menopausal patients at high risk for endometrial carcinoma. (Ref: Radiological Reasoning: Algorithmic Workup of Abnormal Vaginal Bleeding with Endovaginal Sonography and Sonohysterography. AJR 2008; 130:Q65-78) 3. Trace pelvic free fluid, likely physiologic. Electronically Signed   By: Sharlet Salina M.D.   On: 04/13/2023 16:25   US Transvaginal Non-OB  Result Date: 04/13/2023 CLINICAL DATA:  Painless menorrhagia for 2 days, history of uterine fibroids EXAM: TRANSABDOMINAL AND  TRANSVAGINAL ULTRASOUND OF PELVIS TECHNIQUE: Both transabdominal and transvaginal ultrasound examinations of the pelvis were performed. Transabdominal technique was performed for global imaging of the pelvis including uterus, ovaries, adnexal regions, and pelvic cul-de-sac. It was necessary to proceed with endovaginal exam following the transabdominal exam to visualize the endometrium and adnexal structures. COMPARISON:  None Available. FINDINGS: Uterus Measurements: 10.8 x 8.3 by 7.1 cm = volume: 293.2 mL. There are numerous calcified and noncalcified uterine fibroids, largest intramural fibroid in the uterine body measuring up to 4.3 cm in size. There are least 2 submucosal uterine fibroids which measure up to 2.9 and 3.7 cm respectively. Endometrium Thickness: 20 mm. Endometrium is distorted by the multiple uterine fibroids. No discrete mass is identified. Right ovary Measurements: 5.1 x 2.3 x 1.8 cm = volume: 11.4 mL. Normal appearance/no adnexal mass. Left ovary Measurements: 4.4 x 3.2 x 3.8 cm = volume: 28.5 mL. 3 cm minimally complex cyst versus dominant follicle identified. Normal follicles are seen elsewhere. Other findings Trace pelvic free fluid, likely physiologic. IMPRESSION: 1.  Multiple uterine fibroids as above. 2. Thickened endometrium measuring 20 mm, distorted by the numerous uterine fibroids. If bleeding remains unresponsive to hormonal or medical therapy, focal lesion work-up with sonohysterogram should be considered. Endometrial biopsy should also be considered in pre-menopausal patients at high risk for endometrial carcinoma. (Ref: Radiological Reasoning: Algorithmic Workup of Abnormal Vaginal Bleeding with Endovaginal Sonography and Sonohysterography. AJR 2008; 161:W96-04) 3. Trace pelvic free fluid, likely physiologic. Electronically Signed   By: Sharlet Salina M.D.   On: 04/13/2023 16:25    Procedures Procedures    Medications Ordered in ED Medications  tranexamic acid (CYKLOKAPRON)  IVPB 1,000 mg (1,000 mg Intravenous New Bag/Given 04/13/23 1641)    ED Course/ Medical Decision Making/ A&P Clinical Course as of 04/13/23 1645  Sat Apr 13, 2023  1616 MAP (mmHg): 89 [VB]  1618 Txa 1000 IV, 650 (2 tabs) TID for 5 days.  [VB]    Clinical Course User Index [VB] Mardene Sayer, MD                             Medical Decision Making  Alyssa Hubbard is a 33 y.o. female.  With PMH of IDA, obesity presenting with heavy vaginal bleeding x 3 days.   Patient's heavy menstrual bleeding likely secondary to known fibroids.  Ultrasound obtained today notable for multiple uterine fibroids as well as thickened endometrium.  There were no other masses or abnormalities noted.  She is not pregnant, there is no concern for ectopic pregnancy.  She is anemic today and symptomatic with microcytic anemia hemoglobin 6.6.  However she is compensating with no tachycardia or hypotension and is overall well in appearance.  She has normal platelet count and normal coags, no concern for coagulopathy.  She is symptomatic and will require blood transfusion.  However [facility] a [facility] with limited blood products.  I did discuss with Dr. Lynelle Doctor in [facility] for transfer for blood transfusion and reassessment.  I also talked to patient's OB/GYN Dr. Mindi Slicker, recommending blood transfusion as well as IV TXA 1 g 1 dose and starting patient on 1300 mg oral TXA 3 times daily for the next 5 days and calling her on Monday for close follow-up.   Plan is for patient to transfer to [facility] for blood transfusion and reassessment.  If remaining hemodynamically stable likely discharge with TXA and close follow-up with her OB/GYN.  Amount and/or Complexity of Data Reviewed Labs: ordered. Radiology: ordered.  Risk Prescription drug management.     Final Clinical Impression(s) / ED Diagnoses Final diagnoses:  Vaginal bleeding  Anemia, unspecified type  Fibroids  Thickened endometrium    Rx / DC  Orders ED Discharge Orders          Ordered    tranexamic acid (LYSTEDA) 650 MG TABS tablet  3 times daily        04/13/23 1629    ferrous sulfate 325 (65 FE) MG tablet  3 times daily with meals        04/13/23 1639              Mardene Sayer, MD 04/13/23 1645

## 2023-04-14 LAB — TYPE AND SCREEN: Unit division: 0

## 2023-04-14 LAB — BPAM RBC: Unit Type and Rh: 9500

## 2023-06-06 ENCOUNTER — Emergency Department (HOSPITAL_COMMUNITY)
Admission: EM | Admit: 2023-06-06 | Discharge: 2023-06-06 | Disposition: A | Discharge status: Home / Self Care | Payer: Medicaid Other | Attending: Emergency Medicine | Admitting: Emergency Medicine

## 2023-06-06 ENCOUNTER — Other Ambulatory Visit: Payer: Self-pay

## 2023-06-06 ENCOUNTER — Encounter (HOSPITAL_COMMUNITY): Payer: Self-pay | Admitting: Emergency Medicine

## 2023-06-06 DIAGNOSIS — R799 Abnormal finding of blood chemistry, unspecified: Secondary | ICD-10-CM | POA: Diagnosis present

## 2023-06-06 DIAGNOSIS — D649 Anemia, unspecified: Secondary | ICD-10-CM | POA: Diagnosis not present

## 2023-06-06 LAB — CBC WITH DIFFERENTIAL/PLATELET
Abs Immature Granulocytes: 0.01 10*3/uL (ref 0.00–0.07)
Basophils Absolute: 0 10*3/uL (ref 0.0–0.1)
Basophils Relative: 0 %
Eosinophils Absolute: 0 10*3/uL (ref 0.0–0.5)
Eosinophils Relative: 1 %
HCT: 28 % — ABNORMAL LOW (ref 36.0–46.0)
Hemoglobin: 7.8 g/dL — ABNORMAL LOW (ref 12.0–15.0)
Immature Granulocytes: 0 %
Lymphocytes Relative: 15 %
Lymphs Abs: 1.1 10*3/uL (ref 0.7–4.0)
MCH: 19.6 pg — ABNORMAL LOW (ref 26.0–34.0)
MCHC: 27.9 g/dL — ABNORMAL LOW (ref 30.0–36.0)
MCV: 70.5 fL — ABNORMAL LOW (ref 80.0–100.0)
Monocytes Absolute: 0.5 10*3/uL (ref 0.1–1.0)
Monocytes Relative: 6 %
Neutro Abs: 5.6 10*3/uL (ref 1.7–7.7)
Neutrophils Relative %: 78 %
Platelets: 291 10*3/uL (ref 150–400)
RBC: 3.97 MIL/uL (ref 3.87–5.11)
RDW: 17.3 % — ABNORMAL HIGH (ref 11.5–15.5)
WBC: 7.2 10*3/uL (ref 4.0–10.5)
nRBC: 0 % (ref 0.0–0.2)

## 2023-06-06 LAB — BASIC METABOLIC PANEL
Anion gap: 8 (ref 5–15)
BUN: 11 mg/dL (ref 6–20)
CO2: 22 mmol/L (ref 22–32)
Calcium: 8.8 mg/dL — ABNORMAL LOW (ref 8.9–10.3)
Chloride: 104 mmol/L (ref 98–111)
Creatinine, Ser: 0.76 mg/dL (ref 0.44–1.00)
GFR, Estimated: >60 mL/min (ref >60)
Glucose, Bld: 110 mg/dL — ABNORMAL HIGH (ref 70–99)
Potassium: 3.5 mmol/L (ref 3.5–5.1)
Sodium: 134 mmol/L — ABNORMAL LOW (ref 135–145)

## 2023-06-06 LAB — HCG, SERUM, QUALITATIVE: Preg, Serum: NEGATIVE

## 2023-06-06 LAB — TYPE AND SCREEN
ABO/RH(D): A POS
Antibody Screen: NEGATIVE

## 2023-06-06 MED ORDER — FERROUS SULFATE 325 (65 FE) MG PO TABS: 325.0000 mg | ORAL_TABLET | Freq: Three times a day (TID) | ORAL | 0 refills | Status: AC

## 2023-06-06 NOTE — ED Triage Notes (Signed)
Pt had labs drawn at PCP today and hgb 7. Hx of transfusion a few months ago. Denies any bleeding. Last time, pt had heavy vaginal bleeding. Reports known fibroids.

## 2023-06-06 NOTE — ED Provider Notes (Signed)
[place] [facility] AT [facility] Provider Note   CSN: [id number] Arrival date & time: 06/06/23  1635     History  Chief Complaint  Patient presents with   Abnormal Lab    Alyssa Hubbard is a 32 y.o. female.  Patient is a 32 year old female with past medical history of iron deficiency anemia secondary to menorrhagia presenting to the emergency department with concern for anemia.  Patient reports that about a week ago she felt like she was having a panic attack where she became short of breath with some chest pain and is felt lightheaded and weak since then.  She states that she was not evaluated at the emergency department at that time but followed up with her primary doctor this week who performed labs and told her that her hemoglobin was 7 and that she recommended that she come to the emergency department as she will likely need a blood transfusion.  Patient states that she is not currently having any bleeding and has not noticed any black or bloody stools.  She states that she is not on any blood thinners.  The history is provided by the patient and the spouse.  Abnormal Lab      Home Medications Prior to Admission medications   Medication Sig Start Date End Date Taking? Authorizing Provider  ferrous sulfate 325 (65 FE) MG tablet Take 1 tablet (325 mg total) by mouth 3 (three) times daily with meals for 10 days. 06/06/23 06/16/23  Rexford Maus, DO      Allergies    Patient has no known allergies.    Review of Systems   Review of Systems  Physical Exam Updated Vital Signs BP (!) 143/86   Pulse 91   Temp 97.9 F (36.6 C) (Oral)   Resp 19   Ht 5\' 8"  (1.727 m)   Wt 102.1 kg   LMP  (LMP Unknown)   SpO2 100%   BMI 34.21 kg/m  Physical Exam Vitals and nursing note reviewed.  Constitutional:      General: She is not in acute distress.    Appearance: Normal appearance.  HENT:     Head: Normocephalic and atraumatic.     Nose: Nose  normal.     Mouth/Throat:     Mouth: Mucous membranes are moist.     Pharynx: Oropharynx is clear.  Eyes:     Extraocular Movements: Extraocular movements intact.     Conjunctiva/sclera: Conjunctivae normal.  Cardiovascular:     Rate and Rhythm: Normal rate and regular rhythm.     Heart sounds: Normal heart sounds.  Pulmonary:     Effort: Pulmonary effort is normal.     Breath sounds: Normal breath sounds.  Abdominal:     General: Abdomen is flat.     Palpations: Abdomen is soft.     Tenderness: There is no abdominal tenderness.  Musculoskeletal:        General: Normal range of motion.     Cervical back: Normal range of motion.  Skin:    General: Skin is warm and dry.  Neurological:     General: No focal deficit present.     Mental Status: She is alert and oriented to person, place, and time.  Psychiatric:        Mood and Affect: Mood normal.        Behavior: Behavior normal.     ED Results / Procedures / Treatments   Labs (all labs ordered are listed, but  only abnormal results are displayed) Labs Reviewed  CBC WITH DIFFERENTIAL/PLATELET - Abnormal; Notable for the following components:      Result Value   Hemoglobin 7.8 (*)    HCT 28.0 (*)    MCV 70.5 (*)    MCH 19.6 (*)    MCHC 27.9 (*)    RDW 17.3 (*)    All other components within normal limits  BASIC METABOLIC PANEL - Abnormal; Notable for the following components:   Sodium 134 (*)    Glucose, Bld 110 (*)    Calcium 8.8 (*)    All other components within normal limits  HCG, SERUM, QUALITATIVE  TYPE AND SCREEN    EKG EKG Interpretation Date/Time:  Thursday June 06 2023 19:03:32 EDT Ventricular Rate:  96 PR Interval:  184 QRS Duration:  74 QT Interval:  349 QTC Calculation: 441 R Axis:   77  Text Interpretation: Sinus rhythm No significant change since last tracing Confirmed by Elayne Snare ([id number]) on 06/06/2023 7:38:48 PM  Radiology No results found.  Procedures Procedures    Medications  Ordered in ED Medications - No data to display  ED Course/ Medical Decision Making/ A&P Clinical Course as of 06/06/23 1946  Thu Jun 06, 2023  1937 Hgb 7.8 which is around the patient's baseline, does not need transfusion today. Will refill her iron supplements and she has OBGYN follow up scheduled. She is stable for discharge home with outpatient follow up. [VK]    Clinical Course User Index [VK] Rexford Maus, DO                             Medical Decision Making This patient presents to the ED with chief complaint(s) of dizziness, fatigue with pertinent past medical history of iron deficiency anemia which further complicates the presenting complaint. The complaint involves an extensive differential diagnosis and also carries with it a high risk of complications and morbidity.    The differential diagnosis includes symptomatic anemia, dehydration, electrolyte abnormality, arrhythmia  Additional history obtained: Additional history obtained from spouse Records reviewed Primary Care Documents  ED Course and Reassessment: On patient's arrival to the emergency department she is hemodynamically stable in no acute distress.  I am unable to obtain the patient's outpatient labs and she will repeat labs including type and screen here as well as an EKG to evaluate for cause of her symptoms and she will be closely reassessed.  Independent labs interpretation:  The following labs were independently interpreted: at baseline without acute abnormality  Independent visualization of imaging: - N/A  Consultation: - Consulted or discussed management/test interpretation w/ external professional: N/A  Consideration for admission or further workup: Patient has no emergent conditions requiring admission or further work-up at this time and is stable for discharge home with primary care follow-up  Social Determinants of health: N/A    Amount and/or Complexity of Data Reviewed Labs:  ordered.  Risk OTC drugs.          Final Clinical Impression(s) / ED Diagnoses Final diagnoses:  Chronic anemia    Rx / DC Orders ED Discharge Orders          Ordered    ferrous sulfate 325 (65 FE) MG tablet  3 times daily with meals        06/06/23 1945              Rexford Maus, DO 06/06/23 1946

## 2023-06-06 NOTE — Discharge Instructions (Signed)
You were seen in the emergency department for your anemia.  Your hemoglobin was 7.8 today and you did not require blood transfusion.  You should make sure that you are staying well-hydrated and taking your iron supplements as prescribed.  You can follow-up with your primary doctor and your GYN to have your symptoms rechecked.  You should return to the emergency department if you are having significantly worsening weakness, you are dizzy and you pass out, you have severe shortness of breath or if you have any other new or concerning symptoms.

## 2024-01-17 ENCOUNTER — Other Ambulatory Visit: Payer: Self-pay

## 2024-01-17 ENCOUNTER — Emergency Department (HOSPITAL_BASED_OUTPATIENT_CLINIC_OR_DEPARTMENT_OTHER)
Admission: EM | Admit: 2024-01-17 | Discharge: 2024-01-17 | Disposition: A | Discharge status: Home / Self Care | Payer: Medicaid Other | Attending: Emergency Medicine | Admitting: Emergency Medicine

## 2024-01-17 ENCOUNTER — Emergency Department (HOSPITAL_BASED_OUTPATIENT_CLINIC_OR_DEPARTMENT_OTHER): Payer: Medicaid Other | Admitting: Radiology

## 2024-01-17 ENCOUNTER — Encounter (HOSPITAL_BASED_OUTPATIENT_CLINIC_OR_DEPARTMENT_OTHER): Payer: Self-pay | Admitting: Emergency Medicine

## 2024-01-17 DIAGNOSIS — I10 Essential (primary) hypertension: Secondary | ICD-10-CM | POA: Insufficient documentation

## 2024-01-17 DIAGNOSIS — F419 Anxiety disorder, unspecified: Secondary | ICD-10-CM | POA: Insufficient documentation

## 2024-01-17 DIAGNOSIS — R0600 Dyspnea, unspecified: Secondary | ICD-10-CM | POA: Insufficient documentation

## 2024-01-17 DIAGNOSIS — R4589 Other symptoms and signs involving emotional state: Secondary | ICD-10-CM

## 2024-01-17 DIAGNOSIS — R202 Paresthesia of skin: Secondary | ICD-10-CM | POA: Diagnosis not present

## 2024-01-17 LAB — COMPREHENSIVE METABOLIC PANEL
ALT: 10 U/L (ref 0–44)
AST: 12 U/L — ABNORMAL LOW (ref 15–41)
Albumin: 4.4 g/dL (ref 3.5–5.0)
Alkaline Phosphatase: 37 U/L — ABNORMAL LOW (ref 38–126)
Anion gap: 7 (ref 5–15)
BUN: 7 mg/dL (ref 6–20)
CO2: 26 mmol/L (ref 22–32)
Calcium: 9.5 mg/dL (ref 8.9–10.3)
Chloride: 105 mmol/L (ref 98–111)
Creatinine, Ser: 0.69 mg/dL (ref 0.44–1.00)
GFR, Estimated: >60 mL/min (ref >60)
Glucose, Bld: 114 mg/dL — ABNORMAL HIGH (ref 70–99)
Potassium: 3.6 mmol/L (ref 3.5–5.1)
Sodium: 138 mmol/L (ref 135–145)
Total Bilirubin: 0.4 mg/dL (ref 0.0–1.2)
Total Protein: 8.2 g/dL — ABNORMAL HIGH (ref 6.5–8.1)

## 2024-01-17 LAB — CBC WITH DIFFERENTIAL/PLATELET
Abs Immature Granulocytes: 0.02 10*3/uL (ref 0.00–0.07)
Basophils Absolute: 0 10*3/uL (ref 0.0–0.1)
Basophils Relative: 0 %
Eosinophils Absolute: 0.1 10*3/uL (ref 0.0–0.5)
Eosinophils Relative: 1 %
HCT: 33.9 % — ABNORMAL LOW (ref 36.0–46.0)
Hemoglobin: 10.8 g/dL — ABNORMAL LOW (ref 12.0–15.0)
Immature Granulocytes: 0 %
Lymphocytes Relative: 28 %
Lymphs Abs: 1.6 10*3/uL (ref 0.7–4.0)
MCH: 27.8 pg (ref 26.0–34.0)
MCHC: 31.9 g/dL (ref 30.0–36.0)
MCV: 87.1 fL (ref 80.0–100.0)
Monocytes Absolute: 0.2 10*3/uL (ref 0.1–1.0)
Monocytes Relative: 4 %
Neutro Abs: 3.7 10*3/uL (ref 1.7–7.7)
Neutrophils Relative %: 67 %
Platelets: 212 10*3/uL (ref 150–400)
RBC: 3.89 MIL/uL (ref 3.87–5.11)
RDW: 12.9 % (ref 11.5–15.5)
WBC: 5.6 10*3/uL (ref 4.0–10.5)
nRBC: 0 % (ref 0.0–0.2)

## 2024-01-17 NOTE — Discharge Instructions (Signed)
 As discussed, your workup today was overall reassuring.  Your chest x-ray appeared normal.  Your lab test appeared normal as well.  Symptoms could certainly have been related to increased anxious feelings/panic.  Recommend follow-up with your primary care for reassessment.  Please do not hesitate to return if the worrisome signs and symptoms we discussed become apparent.

## 2024-01-17 NOTE — ED Notes (Signed)
 RN reviewed discharge instructions with pt. Pt verbalized understanding and had no further questions. VSS upon discharge.

## 2024-01-17 NOTE — ED Triage Notes (Signed)
 C/o intermittent SHOB x 2 days. Also endorses bilateral leg tingling x 4 weeks. Patient states she "gets like this when she is on her period." NAD.

## 2024-01-17 NOTE — ED Provider Notes (Signed)
 [place] EMERGENCY DEPARTMENT AT [facility] Provider Note   CSN: [id number] Arrival date & time: 01/17/24  1431     History  Chief Complaint  Patient presents with   Shortness of Breath    Alyssa Hubbard is a 33 y.o. female.   Shortness of Breath   33 year old female presents emergency department with complaints of shortness of breath, tingling in her fingers and toes.  States that symptoms been a.m. when she was driving but symptoms have improved since she has been sitting still.  States that she feels like this when she is on her menstrual cycle.  Says she has a history of anemia of which she is post be taking iron supplements but has not been doing so for the past couple of weeks.  States that she feels like her iron is low.  States that she has had to have transfusions before in the past due to her red blood cells being significantly low.  Currently follows with OB/GYN in the outpatient setting due to heavy menstrual cycles and uterine fibroids.  Denies any fever, chills, cough, congestion, chest pain, abdominal pain, nausea, vomiting, urinary symptoms, change in bowel habits, blood thinner use.  Past medical history is significant for anemia, pregnancy-induced hypertension, blood transfusion  Home Medications Prior to Admission medications   Medication Sig Start Date End Date Taking? Authorizing Provider  ferrous sulfate 325 (65 FE) MG tablet Take 1 tablet (325 mg total) by mouth 3 (three) times daily with meals for 10 days. 06/06/23 06/16/23  Rexford Maus, DO      Allergies    Patient has no known allergies.    Review of Systems   Review of Systems  Respiratory:  Positive for shortness of breath.   All other systems reviewed and are negative.   Physical Exam Updated Vital Signs BP (!) 139/98   Pulse 79   Temp 97.7 F (36.5 C)   Resp 18   Ht 5\' 8"  (1.727 m)   Wt 101.6 kg   SpO2 100%   BMI 34.06 kg/m  Physical Exam Vitals and nursing note  reviewed.  Constitutional:      General: She is not in acute distress.    Appearance: She is well-developed.  HENT:     Head: Normocephalic and atraumatic.  Eyes:     Conjunctiva/sclera: Conjunctivae normal.  Cardiovascular:     Rate and Rhythm: Normal rate and regular rhythm.     Heart sounds: No murmur heard. Pulmonary:     Effort: Pulmonary effort is normal. No respiratory distress.     Breath sounds: Normal breath sounds. No wheezing, rhonchi or rales.  Abdominal:     Palpations: Abdomen is soft.     Tenderness: There is no abdominal tenderness.  Musculoskeletal:        General: No swelling.     Cervical back: Neck supple.  Skin:    General: Skin is warm and dry.     Capillary Refill: Capillary refill takes less than 2 seconds.  Neurological:     Mental Status: She is alert.  Psychiatric:        Mood and Affect: Mood normal.     ED Results / Procedures / Treatments   Labs (all labs ordered are listed, but only abnormal results are displayed) Labs Reviewed  CBC WITH DIFFERENTIAL/PLATELET - Abnormal; Notable for the following components:      Result Value   Hemoglobin 10.8 (*)    HCT 33.9 (*)  All other components within normal limits  COMPREHENSIVE METABOLIC PANEL - Abnormal; Notable for the following components:   Glucose, Bld 114 (*)    Total Protein 8.2 (*)    AST 12 (*)    Alkaline Phosphatase 37 (*)    All other components within normal limits    EKG None  Radiology DG Chest 2 View Result Date: 01/17/2024 CLINICAL DATA:  Shortness of breath. EXAM: CHEST - 2 VIEW COMPARISON:  Chest radiographs 05/21/2019 FINDINGS: The heart size and mediastinal contours are within normal limits. Both lungs are clear. The visualized skeletal structures are unremarkable. IMPRESSION: No active cardiopulmonary disease. Electronically Signed   By: Neita Garnet M.D.   On: 01/17/2024 16:24    Procedures Procedures    Medications Ordered in ED Medications - No data to  display  ED Course/ Medical Decision Making/ A&P                                 Medical Decision Making Amount and/or Complexity of Data Reviewed Labs: ordered.   This patient presents to the ED for concern of shortness of breath, this involves an extensive number of treatment options, and is a complaint that carries with it a high risk of complications and morbidity.  The differential diagnosis includes anemia, CHF, ACS, PE, pneumothorax, hemothorax, other   Co morbidities that complicate the patient evaluation  See HPI   Additional history obtained:  Additional history obtained from EMR External records from outside source obtained and reviewed including hospital records   Lab Tests:  I Ordered, and personally interpreted labs.  The pertinent results include: No leukocytosis.  Anemia of 10.8.  Platelets within range.  No electrolyte abnormalities.  No transaminitis.  No renal dysfunction.   Imaging Studies ordered:  I ordered imaging studies including chest x-ray I independently visualized and interpreted imaging which showed no acute cardiopulmonary abnormality I agree with the radiologist interpretation   Cardiac Monitoring: / EKG:  The patient was maintained on a cardiac monitor.  I personally viewed and interpreted the cardiac monitored which showed an underlying rhythm of: Sinus rhythm   Consultations Obtained:  N/a   Problem List / ED Course / Critical interventions / Medication management  Shortness of breath Reevaluation of the patient showed that the patient stayed the same I have reviewed the patients home medicines and have made adjustments as needed   Social Determinants of Health:  Denies tobacco, illicit drug use.   Test / Admission - Considered:  Shortness of breath Vitals signs significant for hypertension blood pressure 145/100. Otherwise within normal range and stable throughout visit. Laboratory/imaging studies significant for: See  above 33 year old female presents emergency department with complaints of shortness of breath tingling in fingertips and toes.  Symptom onset when she was driving earlier today with improvement as she was sitting still.  No exertional worsening of symptoms.  On exam, lungs clear to auscultation.  No evidence clinically of volume overload.  EKG without obvious arrhythmia or evidence of ischemia.  Laboratory studies reassuring with slight anemia but actually with marked improvement from patient's baseline over the past 5 years from prior labs performed.  Patient Wells PE is 0 and PERC negative; low suspicion for PE.  When discussing further regarding patient's mood when symptoms worsen, stated that she felt increasingly anxious and felt as if you are having a "panic attack."  Offered patient further workup via troponin but she  declined.  Offered patient viral testing but she declined given lack of cough, congestion, body aches, fever, etc. suspect the patient's symptoms are likely related to increased anxious feelings.  Will recommend follow-up with PCP in the outpatient setting for reassessment.  Treatment plan discussed length with patient and she acknowledged understanding was agreeable to said plan.  Patient will well-appearing, afebrile in no acute distress. Worrisome signs and symptoms were discussed with the patient, and the patient acknowledged understanding to return to the ED if noticed. Patient was stable upon discharge.          Final Clinical Impression(s) / ED Diagnoses Final diagnoses:  Dyspnea, unspecified type  Tingling  Feeling anxious    Rx / DC Orders ED Discharge Orders     None         Peter Garter, [place] 01/17/24 1630    Vanetta Mulders, MD 01/18/24 1739

## 2024-12-01 ENCOUNTER — Other Ambulatory Visit (HOSPITAL_COMMUNITY): Payer: Self-pay

## 2024-12-09 ENCOUNTER — Telehealth: Payer: Self-pay | Admitting: Pharmacy Technician

## 2024-12-09 NOTE — Telephone Encounter (Signed)
 Auth Submission: NO AUTH NEEDED Site of care: Site of care: [facility] Payer: [place] Medicaid [employer] Medication & CPT/J Code(s) submitted: Feraheme  (ferumoxytol ) [id number] Diagnosis Code:  Route of submission (phone, fax, portal): n/a Phone # Fax # Auth type: Buy/Bill PB Units/visits requested: 510 mg x 2 Reference number: n/a Approval from: 12/09/2024 to 04/30/20026

## 2024-12-10 ENCOUNTER — Ambulatory Visit

## 2024-12-10 VITALS — BP 126/85 | HR 77 | Temp 98.0°F | Resp 16 | Ht 68.0 in | Wt 232.2 lb

## 2024-12-10 DIAGNOSIS — D500 Iron deficiency anemia secondary to blood loss (chronic): Secondary | ICD-10-CM | POA: Diagnosis not present

## 2024-12-10 MED ORDER — SODIUM CHLORIDE 0.9 % IV SOLN
510.0000 mg | Freq: Once | INTRAVENOUS | Status: AC
  Administered 2024-12-10: 510 mg via INTRAVENOUS
  Filled 2024-12-10: qty 17

## 2024-12-10 NOTE — Patient Instructions (Signed)

## 2024-12-10 NOTE — Progress Notes (Signed)
 Diagnosis: Iron Deficiency Anemia  Provider:  Praveen Mannam MD  Procedure: IV Infusion  IV Type: Peripheral, IV Location: R Hand  Feraheme  (Ferumoxytol ), Dose: 510 mg  Infusion Start Time: 0900  Infusion Stop Time: 0918  Post Infusion IV Care: Observation period completed and Peripheral IV Discontinued  Discharge: Condition: Good, Destination: Home . AVS Declined  Performed by:  Alvino Lechuga, RN

## 2024-12-17 ENCOUNTER — Ambulatory Visit

## 2024-12-17 VITALS — BP 125/84 | HR 74 | Temp 98.2°F | Resp 16 | Ht 68.0 in | Wt 238.0 lb

## 2024-12-17 DIAGNOSIS — D500 Iron deficiency anemia secondary to blood loss (chronic): Secondary | ICD-10-CM | POA: Diagnosis not present

## 2024-12-17 MED ORDER — SODIUM CHLORIDE 0.9 % IV SOLN
510.0000 mg | Freq: Once | INTRAVENOUS | Status: AC
  Administered 2024-12-17: 510 mg via INTRAVENOUS
  Filled 2024-12-17: qty 17

## 2024-12-17 NOTE — Progress Notes (Signed)
 Diagnosis: Iron Deficiency Anemia  Provider:  Mannam, Praveen MD  Procedure: IV Infusion  IV Type: Peripheral, IV Location: R Antecubital  Feraheme  (Ferumoxytol ), Dose: 510 mg  Infusion Start Time: 0843  Infusion Stop Time: 0859  Post Infusion IV Care: Patient declined observation and Peripheral IV Discontinued  Discharge: Condition: Stable, Destination: Home . AVS Declined  Performed by:  Rocky FORBES Search, RN
